# Patient Record
Sex: Female | Born: 1990 | Race: White | Hispanic: No | Marital: Single | Smoking: Never smoker
Health system: Southern US, Community
[De-identification: ages and names within clinical notes are randomized; demographics above are authoritative.]

---
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
## 2019-10-19 ENCOUNTER — Encounter: Payer: Self-pay | Admitting: Family Medicine

## 2019-10-19 ENCOUNTER — Ambulatory Visit: Payer: Managed Care, Other (non HMO) | Admitting: Family Medicine

## 2019-10-19 ENCOUNTER — Other Ambulatory Visit: Payer: Self-pay

## 2019-10-19 VITALS — BP 124/80 | HR 84 | Ht 59.0 in | Wt 116.6 lb

## 2019-10-19 DIAGNOSIS — Z124 Encounter for screening for malignant neoplasm of cervix: Secondary | ICD-10-CM | POA: Diagnosis not present

## 2019-10-19 DIAGNOSIS — Z3041 Encounter for surveillance of contraceptive pills: Secondary | ICD-10-CM

## 2019-10-19 DIAGNOSIS — N3281 Overactive bladder: Secondary | ICD-10-CM | POA: Diagnosis not present

## 2019-10-19 DIAGNOSIS — R350 Frequency of micturition: Secondary | ICD-10-CM

## 2019-10-19 DIAGNOSIS — F418 Other specified anxiety disorders: Secondary | ICD-10-CM

## 2019-10-19 DIAGNOSIS — G4700 Insomnia, unspecified: Secondary | ICD-10-CM

## 2019-10-19 LAB — POCT URINALYSIS DIP (PROADVANTAGE DEVICE)
Bilirubin, UA: NEGATIVE
Blood, UA: NEGATIVE
Glucose, UA: NEGATIVE mg/dL
Leukocytes, UA: NEGATIVE
Nitrite, UA: NEGATIVE
Protein Ur, POC: NEGATIVE mg/dL
Specific Gravity, Urine: 1.015
Urobilinogen, Ur: NEGATIVE
pH, UA: 8 (ref 5.0–8.0)

## 2019-10-19 MED ORDER — PROPRANOLOL HCL 10 MG PO TABS: 10.0000 mg | ORAL_TABLET | Freq: Every day | ORAL | 1 refills | Status: DC
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
## 2019-10-19 NOTE — Patient Instructions (Addendum)
Take the Propranolol 30-60 minutes before your anxiety usually starts.    You can call to schedule your appointment with the psychiatrist/counselor. A few offices are listed below for you to call.    [facility]  Ask for a psychiatrist  [address]  (across from [facility])  [phone]      [facility] [address] [place], [place] [address] [phone]   [facility]  [address], [place], [place] [address]  Phone: [phone]   [facility] [address] [place], [place] [address]  Phone: [phone]

## 2019-10-20 LAB — CBC WITH DIFFERENTIAL/PLATELET
Basophils Absolute: 0.1 10*3/uL (ref 0.0–0.2)
Basos: 1 %
EOS (ABSOLUTE): 0 10*3/uL (ref 0.0–0.4)
Eos: 0 %
Hematocrit: 39 % (ref 34.0–46.6)
Hemoglobin: 13 g/dL (ref 11.1–15.9)
Immature Grans (Abs): 0 10*3/uL (ref 0.0–0.1)
Immature Granulocytes: 0 %
Lymphocytes Absolute: 2.2 10*3/uL (ref 0.7–3.1)
Lymphs: 26 %
MCH: 29.9 pg (ref 26.6–33.0)
MCHC: 33.3 g/dL (ref 31.5–35.7)
MCV: 90 fL (ref 79–97)
Monocytes Absolute: 0.5 10*3/uL (ref 0.1–0.9)
Monocytes: 7 %
Neutrophils Absolute: 5.5 10*3/uL (ref 1.4–7.0)
Neutrophils: 66 %
Platelets: 343 10*3/uL (ref 150–450)
RBC: 4.35 x10E6/uL (ref 3.77–5.28)
RDW: 12.1 % (ref 11.7–15.4)
WBC: 8.3 10*3/uL (ref 3.4–10.8)

## 2019-10-20 LAB — COMPREHENSIVE METABOLIC PANEL
ALT: 13 IU/L (ref 0–32)
AST: 12 IU/L (ref 0–40)
Albumin/Globulin Ratio: 1.5 (ref 1.2–2.2)
Albumin: 4.6 g/dL (ref 3.9–5.0)
Alkaline Phosphatase: 60 IU/L (ref 44–121)
BUN/Creatinine Ratio: 14 (ref 9–23)
BUN: 10 mg/dL (ref 6–20)
Bilirubin Total: 0.4 mg/dL (ref 0.0–1.2)
CO2: 21 mmol/L (ref 20–29)
Calcium: 9.7 mg/dL (ref 8.7–10.2)
Chloride: 101 mmol/L (ref 96–106)
Creatinine, Ser: 0.69 mg/dL (ref 0.57–1.00)
GFR calc Af Amer: 138 mL/min/{1.73_m2} (ref >59)
GFR calc non Af Amer: 120 mL/min/{1.73_m2} (ref >59)
Globulin, Total: 3 g/dL (ref 1.5–4.5)
Glucose: 81 mg/dL (ref 65–99)
Potassium: 4.5 mmol/L (ref 3.5–5.2)
Sodium: 139 mmol/L (ref 134–144)
Total Protein: 7.6 g/dL (ref 6.0–8.5)

## 2019-10-20 LAB — TSH: TSH: 1.1 u[IU]/mL (ref 0.450–4.500)

## 2019-10-20 LAB — T4, FREE: Free T4: 1.16 ng/dL (ref 0.82–1.77)

## 2019-10-20 LAB — T3: T3, Total: 174 ng/dL (ref 71–180)

## 2019-10-20 NOTE — Progress Notes (Signed)
Her labs are normal including blood counts, electrolytes, kidney, liver, and thyroid.

## 2019-11-09 ENCOUNTER — Telehealth: Payer: Self-pay | Admitting: Family Medicine

## 2019-11-09 NOTE — Telephone Encounter (Signed)
Requested records received from [facility]

## 2019-11-10 ENCOUNTER — Encounter: Payer: Self-pay | Admitting: Internal Medicine

## 2019-11-10 ENCOUNTER — Other Ambulatory Visit: Payer: Self-pay | Admitting: Family Medicine

## 2019-11-10 DIAGNOSIS — F418 Other specified anxiety disorders: Secondary | ICD-10-CM

## 2019-11-11 ENCOUNTER — Encounter: Payer: Self-pay | Admitting: Family Medicine

## 2019-11-22 ENCOUNTER — Other Ambulatory Visit: Payer: Self-pay

## 2019-11-22 ENCOUNTER — Ambulatory Visit (INDEPENDENT_AMBULATORY_CARE_PROVIDER_SITE_OTHER): Payer: Managed Care, Other (non HMO) | Admitting: Nurse Practitioner

## 2019-11-22 ENCOUNTER — Encounter: Payer: Self-pay | Admitting: Nurse Practitioner

## 2019-11-22 VITALS — BP 120/78 | Wt 114.0 lb

## 2019-11-22 DIAGNOSIS — Z01419 Encounter for gynecological examination (general) (routine) without abnormal findings: Secondary | ICD-10-CM | POA: Diagnosis not present

## 2019-11-22 DIAGNOSIS — Z3041 Encounter for surveillance of contraceptive pills: Secondary | ICD-10-CM | POA: Diagnosis not present

## 2019-11-22 DIAGNOSIS — N3281 Overactive bladder: Secondary | ICD-10-CM

## 2019-11-22 MED ORDER — PIRMELLA 7/7/7 0.5/0.75/1-35 MG-MCG PO TABS: 1.0000 | ORAL_TABLET | Freq: Every day | ORAL | 3 refills | Status: DC

## 2019-11-22 NOTE — Progress Notes (Signed)
   Gina Mcfarland Oct 08, 1990 [id number]   History:  30 y.o. G0 presents to establish care. Monthly cycle on OCPs. Normal pap history. OAB. Prior to starting Ditropan she was voiding hourly and 6+ times a night. She voids much less but still gets up to urinate 2-3 times per night and would like better management of this. Saw urology in the past for this and was told her bladder receptors are overactive. She has tried [employer] but it did not help at all. Not currently sexually active. She is unsure if she has had Gardasil series and will look into it. Takes propanolol for situational anxiety. Has had a hard time finding therapist who is taking new patients.   Gynecologic History Patient's last menstrual period was 11/20/2019. Period Cycle (Days): 28 Period Duration (Days): 5 Period Pattern: Regular Menstrual Flow: Moderate Menstrual Control: Maxi pad, Tampon Dysmenorrhea: (!) Mild Dysmenorrhea Symptoms: Cramping Contraception: OCP (estrogen/progesterone) Last Pap: 5 years ago per patient. Results were: normal  Past medical history, past surgical history, family history and social history were all reviewed and documented in the EPIC chart.  ROS:  A ROS was performed and pertinent positives and negatives are included.  Exam:  Vitals:   11/22/19 1605  BP: 120/78  Weight: 114 lb (51.7 kg)   Body mass index is 23.03 kg/m.  General appearance:  Normal Thyroid:  Symmetrical, normal in size, without palpable masses or nodularity. Respiratory  Auscultation:  Clear without wheezing or rhonchi Cardiovascular  Auscultation:  Regular rate, without rubs, murmurs or gallops  Edema/varicosities:  Not grossly evident Abdominal  Soft,nontender, without masses, guarding or rebound.  Liver/spleen:  No organomegaly noted  Hernia:  None appreciated  Skin  Inspection:  Grossly normal   Breasts: Examined lying and sitting.   Right: Without masses, retractions, discharge or axillary  adenopathy.   Left: Without masses, retractions, discharge or axillary adenopathy. Gentitourinary   Inguinal/mons:  Normal without inguinal adenopathy  External genitalia:  Normal  BUS/Urethra/Skene's glands:  Normal  Vagina:  Normal  Cervix:  Normal  Uterus:  Normal in size, shape and contour.  Midline and mobile  Adnexa/parametria:     Rt: Without masses or tenderness.   Lt: Without masses or tenderness.  Anus and perineum: Normal  Assessment/Plan:  29 y.o. G0 to establish care.  Well female exam with routine gynecological exam - Education provided on SBEs, importance of preventative screenings, current guidelines, high calcium diet, regular exercise, and multivitamin daily. Labs with PCP.   Encounter for surveillance of contraceptive pills - Plan: norethindrone-ethinyl estradiol (PIRMELLA 7/7/7) 0.5/0.75/1-35 MG-MCG tablet daily. Having monthly cycle. Taking as prescribed. Refill x 1 year provided.   Overactive bladder - Prior to starting Ditropan she was voiding hourly and 6+ times a night. She voids much less but still gets up to urinate 2-3 times per night and would like better management for this. Saw urology in the past for this. Will send referral to Urogynecology.   Screening for cervical cancer - normal pap history. Pap with reflex today.   Follow up in 1 year for annual.      Gina Mcfarland [facility], 4:25 PM 11/22/2019

## 2019-11-22 NOTE — Patient Instructions (Signed)
Health Maintenance, Female Adopting a healthy lifestyle and getting preventive care are important in promoting health and wellness. Ask your health care provider about:  The right schedule for you to have regular tests and exams.  Things you can do on your own to prevent diseases and keep yourself healthy. What should I know about diet, weight, and exercise? Eat a healthy diet   Eat a diet that includes plenty of vegetables, fruits, low-fat dairy products, and lean protein.  Do not eat a lot of foods that are high in solid fats, added sugars, or sodium. Maintain a healthy weight Body mass index (BMI) is used to identify weight problems. It estimates body fat based on height and weight. Your health care provider can help determine your BMI and help you achieve or maintain a healthy weight. Get regular exercise Get regular exercise. This is one of the most important things you can do for your health. Most adults should:  Exercise for at least 150 minutes each week. The exercise should increase your heart rate and make you sweat (moderate-intensity exercise).  Do strengthening exercises at least twice a week. This is in addition to the moderate-intensity exercise.  Spend less time sitting. Even light physical activity can be beneficial. Watch cholesterol and blood lipids Have your blood tested for lipids and cholesterol at 27 years of age, then have this test every 5 years. Have your cholesterol levels checked more often if:  Your lipid or cholesterol levels are high.  You are older than 27 years of age.  You are at high risk for heart disease. What should I know about cancer screening? Depending on your health history and family history, you may need to have cancer screening at various ages. This may include screening for:  Breast cancer.  Cervical cancer.  Colorectal cancer.  Skin cancer.  Lung cancer. What should I know about heart disease, diabetes, and high blood  pressure? Blood pressure and heart disease  High blood pressure causes heart disease and increases the risk of stroke. This is more likely to develop in people who have high blood pressure readings, are of African descent, or are overweight.  Have your blood pressure checked: ? Every 3-5 years if you are 18-39 years of age. ? Every year if you are 40 years old or older. Diabetes Have regular diabetes screenings. This checks your fasting blood sugar level. Have the screening done:  Once every three years after age 40 if you are at a normal weight and have a low risk for diabetes.  More often and at a younger age if you are overweight or have a high risk for diabetes. What should I know about preventing infection? Hepatitis B If you have a higher risk for hepatitis B, you should be screened for this virus. Talk with your health care provider to find out if you are at risk for hepatitis B infection. Hepatitis C Testing is recommended for:  Everyone born from 1945 through 1965.  Anyone with known risk factors for hepatitis C. Sexually transmitted infections (STIs)  Get screened for STIs, including gonorrhea and chlamydia, if: ? You are sexually active and are younger than 27 years of age. ? You are older than 27 years of age and your health care provider tells you that you are at risk for this type of infection. ? Your sexual activity has changed since you were last screened, and you are at increased risk for chlamydia or gonorrhea. Ask your health care provider if   you are at risk.  Ask your health care provider about whether you are at high risk for HIV. Your health care provider may recommend a prescription medicine to help prevent HIV infection. If you choose to take medicine to prevent HIV, you should first get tested for HIV. You should then be tested every 3 months for as long as you are taking the medicine. Pregnancy  If you are about to stop having your period (premenopausal) and  you may become pregnant, seek counseling before you get pregnant.  Take 400 to 800 micrograms (mcg) of folic acid every day if you become pregnant.  Ask for birth control (contraception) if you want to prevent pregnancy. Osteoporosis and menopause Osteoporosis is a disease in which the bones lose minerals and strength with aging. This can result in bone fractures. If you are 65 years old or older, or if you are at risk for osteoporosis and fractures, ask your health care provider if you should:  Be screened for bone loss.  Take a calcium or vitamin D supplement to lower your risk of fractures.  Be given hormone replacement therapy (HRT) to treat symptoms of menopause. Follow these instructions at home: Lifestyle  Do not use any products that contain nicotine or tobacco, such as cigarettes, e-cigarettes, and chewing tobacco. If you need help quitting, ask your health care provider.  Do not use street drugs.  Do not share needles.  Ask your health care provider for help if you need support or information about quitting drugs. Alcohol use  Do not drink alcohol if: ? Your health care provider tells you not to drink. ? You are pregnant, may be pregnant, or are planning to become pregnant.  If you drink alcohol: ? Limit how much you use to 0-1 drink a day. ? Limit intake if you are breastfeeding.  Be aware of how much alcohol is in your drink. In the [place] one drink equals one 12 oz bottle of beer (355 mL), one 5 oz glass of wine (148 mL), or one 1 oz glass of hard liquor (44 mL). General instructions  Schedule regular health, dental, and eye exams.  Stay current with your vaccines.  Tell your health care provider if: ? You often feel depressed. ? You have ever been abused or do not feel safe at home. Summary  Adopting a healthy lifestyle and getting preventive care are important in promoting health and wellness.  Follow your health care provider's instructions about healthy  diet, exercising, and getting tested or screened for diseases.  Follow your health care provider's instructions on monitoring your cholesterol and blood pressure. This information is not intended to replace advice given to you by your health care provider. Make sure you discuss any questions you have with your health care provider. Document Revised: 12/23/2017 Document Reviewed: 12/23/2017 Elsevier Patient Education  2020 Elsevier Inc.  

## 2019-11-22 NOTE — Addendum Note (Signed)
Addended by: Tito Dine on: 11/22/2019 04:36 PM   Modules accepted: Orders

## 2019-11-23 ENCOUNTER — Telehealth: Payer: Self-pay | Admitting: *Deleted

## 2019-11-23 DIAGNOSIS — N3281 Overactive bladder: Secondary | ICD-10-CM

## 2019-11-23 LAB — PAP IG W/ RFLX HPV ASCU

## 2019-11-23 NOTE — Telephone Encounter (Signed)
Referral placed at Dr.Shroeder office they will call to schedule.

## 2019-11-23 NOTE — Telephone Encounter (Signed)
-----   Message from [name], NP sent at 11/22/2019  4:33 PM EST ----- Please send referral to Dr. Tildon Husky with Urogynecology for overactive bladder.

## 2019-12-12 NOTE — Telephone Encounter (Signed)
Scheduled on 12/19/19 @ 3:00pm
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
## 2019-12-19 ENCOUNTER — Other Ambulatory Visit: Payer: Self-pay

## 2019-12-19 ENCOUNTER — Ambulatory Visit (INDEPENDENT_AMBULATORY_CARE_PROVIDER_SITE_OTHER): Payer: Managed Care, Other (non HMO) | Admitting: Obstetrics and Gynecology

## 2019-12-19 ENCOUNTER — Encounter: Payer: Self-pay | Admitting: Obstetrics and Gynecology

## 2019-12-19 VITALS — BP 118/63 | HR 68 | Temp 98.2°F | Ht 59.0 in | Wt 114.5 lb

## 2019-12-19 DIAGNOSIS — R350 Frequency of micturition: Secondary | ICD-10-CM | POA: Diagnosis not present

## 2019-12-19 DIAGNOSIS — K5900 Constipation, unspecified: Secondary | ICD-10-CM | POA: Diagnosis not present

## 2019-12-19 DIAGNOSIS — R351 Nocturia: Secondary | ICD-10-CM | POA: Diagnosis not present

## 2019-12-19 LAB — POCT URINALYSIS DIPSTICK
Appearance: NORMAL
Bilirubin, UA: NEGATIVE
Blood, UA: NEGATIVE
Glucose, UA: NEGATIVE
Leukocytes, UA: NEGATIVE
Nitrite, UA: NEGATIVE
Protein, UA: NEGATIVE
Spec Grav, UA: 1.025 (ref 1.010–1.025)
Urobilinogen, UA: 0.2 E.U./dL
pH, UA: 6 (ref 5.0–8.0)

## 2019-12-19 NOTE — Patient Instructions (Addendum)
Constipation: Our goal is to achieve formed bowel movements daily or every-other-day.  You may need to try different combinations of the following options to find what works best for you - everybody's body works differently so feel free to adjust the dosages as needed.  Some options to help maintain bowel health include:  Marland Kitchen Dietary changes (more leafy greens, vegetables and fruits; less processed foods) . Fiber supplementation (Benefiber, FiberCon, Metamucil or Psyllium). Start slow and increase gradually to full dose. . Over-the-counter agents such as: stool softeners (Docusate or Colace) and/or laxatives (Miralax, milk of magnesia)  . "Power Pudding" is a natural mixture that may help your constipation.  To make blend 1 cup applesauce, 1 cup wheat bran, and 3/4 cup prune juice, refrigerate and then take 1 tablespoon daily with a large glass of water as needed.   Today we talked about ways to manage bladder urgency such as altering your diet to avoid irritative beverages and foods (bladder diet) as well as attempting to decrease stress and other exacerbating factors.    There is a website with helpful information for people with bladder irritation, called the IC Network at https://www.ic-network.com. This website has more information about a healthy bladder diet and patient forums for support.  The Most Bothersome Foods* The Least Bothersome Foods*  Coffee - Regular & Decaf Tea - caffeinated Carbonated beverages - cola, non-colas, diet & caffeine-free Alcohols - Beer, Red Wine, White Wine, [address] - Grapefruit, [place], Orange, [employer] - Cranberry, Grapefruit, Orange, Pineapple Vegetables - Tomato & Tomato Products Flavor Enhancers - Hot peppers, Spicy foods, Chili, Horseradish, Vinegar, Monosodium glutamate (MSG) Artificial Sweeteners - NutraSweet, Sweet 'N Low, Equal (sweetener), Saccharin Ethnic foods - [place], [place], [place] food [employer] - low-fat & whole Fruits -  Bananas, Blueberries, Honeydew melon, Pears, Raisins, Watermelon Vegetables - Broccoli, [address] Sprouts, [place], Carrots, Cauliflower, [place], Cucumber, Mushrooms, Peas, Radishes, Squash, Zucchini, White potatoes, Sweet potatoes & yams Poultry - Chicken, Eggs, [place], [employer] - Beef, Diplomatic Services operational officer, Lamb Seafood - Shrimp, [place] fish, Salmon Grains - Oat, Rice Snacks - Pretzels, Popcorn  *Lenward Chancellor et al. Diet and its role in interstitial cystitis/bladder pain syndrome (IC/BPS) and comorbid conditions. [employer]. BJU Int. 2012 Jan 11.

## 2020-01-17 ENCOUNTER — Encounter: Payer: Managed Care, Other (non HMO) | Attending: Obstetrics and Gynecology | Admitting: Physical Therapy

## 2020-01-17 ENCOUNTER — Encounter: Payer: Self-pay | Admitting: Physical Therapy

## 2020-01-17 ENCOUNTER — Other Ambulatory Visit: Payer: Self-pay

## 2020-01-17 DIAGNOSIS — R278 Other lack of coordination: Secondary | ICD-10-CM | POA: Diagnosis not present

## 2020-01-17 DIAGNOSIS — M6281 Muscle weakness (generalized): Secondary | ICD-10-CM

## 2020-01-17 DIAGNOSIS — R252 Cramp and spasm: Secondary | ICD-10-CM | POA: Insufficient documentation

## 2020-01-17 NOTE — Therapy (Signed)
[facility] at [facility] [address], [address] [place], [place], [address] Phone: [phone]   Fax:  [phone]  Physical Therapy Evaluation  Patient Details  Name: Gina Mcfarland MRN: [id number] Date of Birth: [birth year] Referring Provider (PT): Dr. Earlean Polka   Encounter Date: 01/17/2020   PT End of Session - 01/17/20 1048    Visit Number 1    Date for PT Re-Evaluation 04/10/20    Authorization Type [employer]    PT Start Time 0830    PT Stop Time 0925    PT Time Calculation (min) 55 min    Activity Tolerance Patient tolerated treatment well    Behavior During Therapy [facility] for tasks assessed/performed           History reviewed. No pertinent past medical history.  History reviewed. No pertinent surgical history.  There were no vitals filed for this visit.    Subjective Assessment - 01/17/20 0829    Subjective Patient reports 6 years ago her urinary frequency increased and is getting worse. She is not drinking before bed and limiting caffiene. Tried The Oxybutin. She urinated 24 times in 24 hours. Most of urination in the middle of the night. Wakes up 5 times per night. A good night goes 2 times per night but that is rare. When patient goes to her parents in [place] will urinate more. Patient is seeing a therapist to help her. Urinate just in case. Patient has a dog.    Patient Stated Goals improve sleep by not urinating as much, possible get off medication due to increased urination    Currently in Pain? No/denies              [facility] PT Assessment - 01/17/20 9326      Strength   Right Hip Extension 4/5    Right Hip ABduction 3+/5      Palpation   Spinal mobility Decreased lower rib cage mobility    SI assessment  Left ilim post. rotated      Special Tests    Special Tests Sacrolliac Tests      Pelvic Compression   Findings Positive    Side Left    comment pain                      Objective  measurements completed on examination: See above findings.     Pelvic Floor Special Questions - 01/17/20 0001    Prior Pregnancies No    Currently Sexually Active No   in the past did not hurt   Urinary Leakage Yes    Activities that cause leaking Sneezing;Other    Other activities that cause leaking vomiting    Urinary urgency Yes   during the day better, night not easy   Urinary frequency 24 times in 24 hours, if delays during long trip will have increased pain, has to push to have a urine stream, sometimes has a weak urine stream    Fecal incontinence No   Type 4, night has feeling for BM but not able to   Caffeine beverages [place] travel mug in AM, ginger ale in afternoon    Falling out feeling (prolapse) No    Skin Integrity Intact   some dryness   Perineal Body/Introitus  Elevated   tight   Pelvic Floor Internal Exam Patient confirms identification to assess pelvicmuscles and treatment    Exam Type Vaginal    Palpation perineal body, bulbocavernosus, urethra sphincter are tight  Strength Flicker   difficulty coordinating contraction   Tone increased tone            [facility] Adult PT Treatment/Exercise - 01/17/20 0001      Self-Care   Self-Care Other Self-Care Comments    Other Self-Care Comments  education with you tube videos for pelvic floor meditation and perineal massage                  PT Education - 01/17/20 0935    Education Details Access Code: [email or web address]; meditation, perineal massage    Person(s) Educated Patient    Methods Explanation;Demonstration;Verbal cues;Handout    Comprehension Verbalized understanding;Returned demonstration            PT Short Term Goals - 01/17/20 1100      PT SHORT TERM GOAL #1   Title independent iwth intial stretchs of the hips to elongate the pelvic floor    Time 4    Period Weeks    Status New    Target Date 02/14/20      PT SHORT TERM GOAL #2   Title able to perform diaphragmatic breathing to open up the  pelvic floor to reduce tightness    Time 4    Period Weeks    Status New    Target Date 02/14/20      PT SHORT TERM GOAL #3   Title able to expand the lower ribe cage adn open up the back to move the diaphragm fully    Time 4    Period Weeks    Status New    Target Date 02/14/20             PT Long Term Goals - 01/17/20 1102      PT LONG TERM GOAL #1   Title independent with advanced HEP    Time 12    Period Weeks    Status New    Target Date 04/10/20      PT LONG TERM GOAL #2   Title reduce the need to urinate at night to 2 times weekly compared to 5 times per night due to the ability to relax the pelvic floor    Time 12    Period Weeks    Status New    Target Date 04/10/20      PT LONG TERM GOAL #3   Title able to wait 3 or more hours to urinate during the day due to using behavoral techniques    Time 12    Period Weeks    Status New    Target Date 04/10/20      PT LONG TERM GOAL #4   Title pelvic floro coordination improved and strength >/= 3/5 so she is not leaking urine with coughing and vomiting    Time 12    Period Weeks    Status New    Target Date 04/10/20                  Plan - 01/17/20 0936    Clinical Impression Statement Patient is a 30 year old female with frequent urination and nocturia for the past 6 years. Patient will wake up 5 times per night and urinate 24 times in a 24 hour period. Night time is the hardest to go back to sleep after having the urge to urinate. When she is staying at her parents the urge to urinate is more frequent and more at night. Patient has difficulty with getting enough sleep due  to the urge to urinate frequently. She has full lumbar ROM. the left ilium is rotated posteriorly with a positive compression test. She has weakness in the right hip abduction and extensor. Pelvic floor strength is 1/5 and she has difficulty with coordination of a contraction and needs many verbal cues. She has tightness in the perineal  body, urethra sphincter, bulbocavernosus. The pelvic floor muscles have increased tone. She needs many verbal cues to elongate the  pelvic floor with breath but was able to do it partially one time. Lower rib cage has decreased movement. Patient will leak urine with a hard cough or when vomiting. Patient will benefit from skilled therapy to elongate the pelvic floor to decreased tone and irritation to cause urge to void.    Personal Factors and Comorbidities Time since onset of injury/illness/exacerbation    Examination-Activity Limitations Sleep;Continence    Stability/Clinical Decision Making Stable/Uncomplicated    Clinical Decision Making Low    Rehab Potential Excellent    PT Frequency 1x / week    PT Duration 12 weeks    PT Treatment/Interventions ADLs/Self Care Home Management;Biofeedback;Electrical Stimulation;Moist Heat;Ultrasound;Therapeutic activities;Therapeutic exercise;Neuromuscular re-education;Patient/family education;Manual techniques;Dry needling;Spinal Manipulations    PT Next Visit Plan continues with stretches for piriformis, reverse clams, pull backs, manual owrk to expand the lower rib cage, sitting on ball to massage, see how the pelvic drop is doing, urge to void, squat to stretch pelvic floor    PT Home Exercise Plan Access Code: [email or web address]    Consulted and Agree with Plan of Care Patient           Patient will benefit from skilled therapeutic intervention in order to improve the following deficits and impairments:  Decreased endurance,Decreased activity tolerance,Decreased strength,Increased fascial restricitons,Increased muscle spasms,Decreased coordination  Visit Diagnosis: Muscle weakness (generalized) - Plan: PT plan of care cert/re-cert  Other lack of coordination - Plan: PT plan of care cert/re-cert  Cramp and spasm - Plan: PT plan of care cert/re-cert     Problem List There are no problems to display for this patient.   Eulis Foster, PT 01/17/20 11:07  AM   [facility] at [facility] [address] [place], [place], [address] Phone: [phone]   Fax:  [phone]  Name: Reneta Niehaus MRN: [id number] Date of Birth: Feb 24, 1990

## 2020-01-17 NOTE — Patient Instructions (Addendum)
   Guided Meditation for Pelvic Floor Relaxation  [employer]  Ujjayi Breathing + Letting Go of Pain or Anxiety (Meditation) [id number] viewsJun 12, 2017 by fem fusion    The "Pelvic Drop" to Release Pelvic Floor Tension: Three Visualizations by fem fusion  Gina Mcfarland, PT [facility] [address] [place], [place] [address] W: [phone] [email or web address]  Access Code: [email or web address] URL: [email or web address] Date: 01/17/2020 Prepared by: Gina Mcfarland  Exercises Supine Diaphragmatic Breathing - 1 x daily - 7 x weekly - 1 sets - 10 reps Supine Pelvic Floor Stretch - 1 x daily - 7 x weekly - 1 sets - 1 reps - 1 min hold Hip Hinge Rock Back - 1 x daily - 7 x weekly - 1 sets - 10 reps

## 2020-01-19 ENCOUNTER — Ambulatory Visit: Payer: Managed Care, Other (non HMO) | Admitting: Family Medicine

## 2020-01-20 ENCOUNTER — Encounter: Payer: Self-pay | Admitting: Family Medicine

## 2020-01-24 ENCOUNTER — Encounter: Payer: Managed Care, Other (non HMO) | Admitting: Physical Therapy

## 2020-01-24 ENCOUNTER — Encounter: Payer: Self-pay | Admitting: Physical Therapy

## 2020-01-24 ENCOUNTER — Other Ambulatory Visit: Payer: Self-pay

## 2020-01-24 DIAGNOSIS — R278 Other lack of coordination: Secondary | ICD-10-CM

## 2020-01-24 DIAGNOSIS — R252 Cramp and spasm: Secondary | ICD-10-CM

## 2020-01-24 DIAGNOSIS — M6281 Muscle weakness (generalized): Secondary | ICD-10-CM

## 2020-01-24 NOTE — Patient Instructions (Addendum)
STRETCHING THE PELVIC FLOOR MUSCLES NO DILATOR  Supplies . Vaginal lubricant . Mirror (optional) . Gloves (optional) or clean hands Positioning . Start in a semi-reclined position with your head propped up. Bend your knees and place your thumb or finger at the vaginal opening. Procedure . Apply a moderate amount of lubricant on the outer skin of your vagina, the labia minora.  Apply additional lubricant to your finger. Marland Kitchen Spread the skin away from the vaginal opening. Place the end of your finger at the opening. . Do a maximum contraction of the pelvic floor muscles. Tighten the vagina and the anus maximally and relax. . When you know they are relaxed, gently and slowly insert your finger into your vagina, directing your finger slightly downward, for 2-3 inches of insertion. . Relax and stretch the 6 o'clock position . Hold each stretch for _30-60 seconds, no pain more than 3/10 . Repeat the stretching in the 4 o'clock and 8 o'clock positions. . Next gently move your finger in a "U" shape  several times.  . You can also enter a second finger to work to spread the vaginal opening wider from 3:00-6:00 and 6:00-9:00 or 3:00-9:00 . Perform daily or every other day . Once you have accomplished the techniques you may try them in standing with one foot resting on the tub, or in other positions.  This is a good stretch to do in the shower if you don't need to use lubricant.   Eulis Foster, PT [facility] [address], [address] [place], [place] [address] W: [phone] [email or web address]   Access Code: [email or web address] URL: [email or web address] Date: 01/24/2020 Prepared by: Eulis Foster  Program Notes sit on a tennis ball and roll back and forth to massage the pelvic floor   Exercises Supine Diaphragmatic Breathing - 1 x daily - 7 x weekly - 1 sets - 10 reps Supine Pelvic Floor Stretch - 1 x daily - 7 x weekly - 1 sets - 1 reps - 1 min hold Hip Hinge  Rock Back - 1 x daily - 7 x weekly - 1 sets - 10 reps Deep Squat with Pelvic Floor Relaxation - 1 x daily - 7 x weekly - 1 sets - 1 reps - 30-60 sec hold Pigeon Pose - 1 x daily - 7 x weekly - 1 sets - 1 reps - 30 sec hold

## 2020-01-24 NOTE — Therapy (Signed)
[facility] at [facility] [address] [place], [place], [address] Phone: [phone]   Fax:  [phone]  Physical Therapy Treatment  Patient Details  Name: Gina Mcfarland MRN: [id number] Date of Birth: September 26, 1990 Referring Provider (PT): Dr. Earlean Polka   Encounter Date: 01/24/2020   PT End of Session - 01/24/20 1601    Visit Number 2    Date for PT Re-Evaluation 04/10/20    Authorization Type [employer]igna    PT Start Time 1500    PT Stop Time 1555    PT Time Calculation (min) 55 min    Activity Tolerance Patient tolerated treatment well;No increased pain    Behavior During Therapy [facility] for tasks assessed/performed           History reviewed. No pertinent past medical history.  History reviewed. No pertinent surgical history.  There were no vitals filed for this visit.   Subjective Assessment - 01/24/20 1513    Subjective I started the exericses 4 days ago. I have a couple of nights sleeping to 4.    Patient Stated Goals improve sleep by not urinating as much, possible get off medication due to increased urination    Currently in Pain? No/denies    Multiple Pain Sites No                          Pelvic Floor Special Questions - 01/24/20 0001    Pelvic Floor Internal Exam Patient confirms identification to assess pelvicmuscles and treatment    Exam Type Vaginal    Palpation perineal body, bulbocavernosus, urethra sphincter are tight             [facility] Adult PT Treatment/Exercise - 01/24/20 0001      Lumbar Exercises: Stretches   Piriformis Stretch Right;Left;1 rep;30 seconds    Piriformis Stretch Limitations pigeon pose    Other Lumbar Stretch Exercise deep squat with diaphragmatic breathing    Other Lumbar Stretch Exercise happy baby holding for 1 min      Lumbar Exercises: Sidelying   Other Sidelying Lumbar Exercises reverse clamb 10x each side      Manual Therapy   Manual Therapy Internal  Pelvic Floor    Manual therapy comments educated patient on how to massage the perineal muscles at home buy using her thumb internally and sitting on a tennis ball to massage the muscles    Internal Pelvic Floor manual work to the perineal body, bulbocavernosus, superior transverse in right sidely                  PT Education - 01/24/20 1558    Education Details Access Code: [email or web address]; perineal massage internally and outside    [employer]) Educated Patient    Methods Explanation;Demonstration;Verbal cues;Handout    Comprehension Returned demonstration;Verbalized understanding            PT Short Term Goals - 01/24/20 1606      PT SHORT TERM GOAL #1   Title independent iwth intial stretchs of the hips to elongate the pelvic floor    Time 4    Period Weeks    Status On-going      PT SHORT TERM GOAL #2   Title able to perform diaphragmatic breathing to open up the pelvic floor to reduce tightness    Time 4    Period Weeks    Status Achieved  PT Long Term Goals - 01/17/20 1102      PT LONG TERM GOAL #1   Title independent with advanced HEP    Time 12    Period Weeks    Status New    Target Date 04/10/20      PT LONG TERM GOAL #2   Title reduce the need to urinate at night to 2 times weekly compared to 5 times per night due to the ability to relax the pelvic floor    Time 12    Period Weeks    Status New    Target Date 04/10/20      PT LONG TERM GOAL #3   Title able to wait 3 or more hours to urinate during the day due to using behavoral techniques    Time 12    Period Weeks    Status New    Target Date 04/10/20      PT LONG TERM GOAL #4   Title pelvic floro coordination improved and strength >/= 3/5 so she is not leaking urine with coughing and vomiting    Time 12    Period Weeks    Status New    Target Date 04/10/20                 Plan - 01/24/20 1602    Clinical Impression Statement Patient has been doing her exercises. She is  able to do diaphragmatic breathing and elongate the pelvic floor. Her perineal body, bulbocavernosus, and superior transverse muscle is tight. Patient has learned stretches to elongate the muscles. Patient has several nights she was able to sleep to 4 AM. Patient will benefit from skilled therapy to elongate the pelvic floor to decrease tone and irritation to cause urge to void.    Personal Factors and Comorbidities Time since onset of injury/illness/exacerbation    Examination-Activity Limitations Sleep;Continence    Stability/Clinical Decision Making Stable/Uncomplicated    Rehab Potential Excellent    PT Frequency 1x / week    PT Duration 12 weeks    PT Treatment/Interventions ADLs/Self Care Home Management;Biofeedback;Electrical Stimulation;Moist Heat;Ultrasound;Therapeutic activities;Therapeutic exercise;Neuromuscular re-education;Patient/family education;Manual techniques;Dry needling;Spinal Manipulations    PT Next Visit Plan manual pelvic  tissue work, go over the urge to void, inner thight, possible dry needling, check on the lower rib cage    PT Home Exercise Plan Access Code: [email or web address]    Recommended Other Services MD signed initial eval    Consulted and Agree with Plan of Care Patient           Patient will benefit from skilled therapeutic intervention in order to improve the following deficits and impairments:  Decreased endurance,Decreased activity tolerance,Decreased strength,Increased fascial restricitons,Increased muscle spasms,Decreased coordination  Visit Diagnosis: Muscle weakness (generalized)  Other lack of coordination  Cramp and spasm     Problem List There are no problems to display for this patient.   Eulis Foster, PT 01/24/20 4:07 PM   [facility] [facility] at [facility] [address], [address] [place], [place], [address] Phone: [phone]   Fax:  [phone]  Name: Kimyata Milich MRN: [id number] Date of Birth:  Jan 22, 1990

## 2020-02-02 ENCOUNTER — Ambulatory Visit: Payer: Managed Care, Other (non HMO) | Admitting: Family Medicine

## 2020-02-02 ENCOUNTER — Encounter: Payer: Self-pay | Admitting: Family Medicine

## 2020-02-02 ENCOUNTER — Other Ambulatory Visit: Payer: Self-pay

## 2020-02-02 VITALS — BP 110/70 | HR 85 | Wt 116.6 lb

## 2020-02-02 DIAGNOSIS — N3281 Overactive bladder: Secondary | ICD-10-CM

## 2020-02-02 DIAGNOSIS — F418 Other specified anxiety disorders: Secondary | ICD-10-CM

## 2020-02-02 MED ORDER — OXYBUTYNIN CHLORIDE ER 10 MG PO TB24: 10.0000 mg | ORAL_TABLET | Freq: Every day | ORAL | 2 refills | Status: DC

## 2020-02-02 NOTE — Progress Notes (Signed)
   Subjective:    Patient ID: Gina Mcfarland, female    DOB: 10/25/1990, 30 y.o.   MRN: [id number]  HPI Chief Complaint  Patient presents with  . follow-up    Anixety, going to PT for pelvic health and then going to counseling-   She is here for follow-up on chronic health conditions.  I last saw her in October 2021.  States her anxiety has improved.  She is involved in counseling, she now has a dog and is also taking propanolol as needed.  She reports noticing a significant improvement. Loralie Champagne is her counselor.   In regards to her overactive bladder, she is seeing a pelvic therapist and has noticed improvement in this area.  She is also taking oxybutynin and requests a refill.  States her goal is to eventually stop this medication.  No new concerns today.  Denies fever, chills, dizziness, chest pain, shortness of breath, abdominal pain, N/V/D.    Reviewed allergies, medications, past medical, surgical, family, and social history.    Review of Systems Pertinent positives and negatives in the history of present illness.     Objective:   Physical Exam BP 110/70   Pulse 85   Wt 116 lb 9.6 oz (52.9 kg)   BMI 23.55 kg/m   Alert and oriented in no acute distress.  Respirations unlabored.  Normal speech, mood.       Assessment & Plan:  Situational anxiety  OAB (overactive bladder) - Plan: oxybutynin (DITROPAN-XL) 10 MG 24 hr tablet  She is here today for follow-up and she appears to be doing much better than her previous visit.  She will continue with counseling and propanolol as needed.  Discussed that having a new companion such as her dog is of great benefit.  I will refill her oxybutynin.  She will continue with pelvic therapy.  Follow-up as needed.

## 2020-02-13 ENCOUNTER — Other Ambulatory Visit: Payer: Self-pay | Admitting: Family Medicine

## 2020-02-13 DIAGNOSIS — F418 Other specified anxiety disorders: Secondary | ICD-10-CM

## 2020-02-14 ENCOUNTER — Other Ambulatory Visit: Payer: Self-pay

## 2020-02-14 ENCOUNTER — Encounter: Payer: Self-pay | Admitting: Physical Therapy

## 2020-02-14 ENCOUNTER — Encounter: Payer: Managed Care, Other (non HMO) | Attending: Obstetrics and Gynecology | Admitting: Physical Therapy

## 2020-02-14 DIAGNOSIS — M6281 Muscle weakness (generalized): Secondary | ICD-10-CM | POA: Diagnosis not present

## 2020-02-14 DIAGNOSIS — R278 Other lack of coordination: Secondary | ICD-10-CM | POA: Diagnosis present

## 2020-02-14 DIAGNOSIS — R252 Cramp and spasm: Secondary | ICD-10-CM

## 2020-02-14 NOTE — Therapy (Signed)
[facility]patient Rehabilitation at [facility] [address], [address] [place], [place], [address] Phone: [phone]   Fax:  [phone]  Physical Therapy Treatment  Patient Details  Name: Gina Mcfarland MRN: [id number] Date of Birth: 1990-07-09 Referring Provider (PT): Dr. Earlean Polka   Encounter Date: 02/14/2020   PT End of Session - 02/14/20 1702    Visit Number 3    Date for PT Re-Evaluation 04/10/20    Authorization Type [employer]    PT Start Time 1606    PT Stop Time 1700    PT Time Calculation (min) 54 min    Activity Tolerance Patient tolerated treatment well;No increased pain    Behavior During Therapy [facility] for tasks assessed/performed           History reviewed. No pertinent past medical history.  History reviewed. No pertinent surgical history.  There were no vitals filed for this visit.   Subjective Assessment - 02/14/20 1614    Subjective I feel better. I have been sleeping more. Gets up 2-3 times but before it was every hour. Some days will go more than she wants and will do that when she is more anxious.    Patient Stated Goals improve sleep by not urinating as much, possible get off medication due to increased urination    Currently in Pain? No/denies              [facility] PT Assessment - 02/14/20 0001      Strength   Right Hip Extension 4/5    Right Hip ABduction 4/5      Palpation   SI assessment  ASIS is equal                         [facility] Adult PT Treatment/Exercise - 02/14/20 0001      Self-Care   Self-Care Other Self-Care Comments    Other Self-Care Comments  discussed her routine to include her exercises      Neuro Re-ed    Neuro Re-ed Details  pelvic floor drop with breath and therapist guing the pelvic floor muscles downward with right side moving better than the left      Manual Therapy   Manual Therapy Myofascial release;Internal Pelvic Floor    Myofascial Release release of the urogenital  diaphgram going throug the 3 layers, tissue rolling of the abdominals    Internal Pelvic Floor manual work to the perineal body, bulbocavernosus, superior transverse and levator ani in supine   left side tighter than the right                   PT Short Term Goals - 02/14/20 1705      PT SHORT TERM GOAL #1   Title independent iwth intial stretchs of the hips to elongate the pelvic floor    Time 4    Period Weeks    Status Achieved      PT SHORT TERM GOAL #2   Title able to perform diaphragmatic breathing to open up the pelvic floor to reduce tightness    Time 4    Period Weeks    Status Achieved      PT SHORT TERM GOAL #3   Title able to expand the lower ribe cage adn open up the back to move the diaphragm fully    Time 4    Period Weeks    Status On-going  PT Long Term Goals - 01/17/20 1102      PT LONG TERM GOAL #1   Title independent with advanced HEP    Time 12    Period Weeks    Status New    Target Date 04/10/20      PT LONG TERM GOAL #2   Title reduce the need to urinate at night to 2 times weekly compared to 5 times per night due to the ability to relax the pelvic floor    Time 12    Period Weeks    Status New    Target Date 04/10/20      PT LONG TERM GOAL #3   Title able to wait 3 or more hours to urinate during the day due to using behavoral techniques    Time 12    Period Weeks    Status New    Target Date 04/10/20      PT LONG TERM GOAL #4   Title pelvic floro coordination improved and strength >/= 3/5 so she is not leaking urine with coughing and vomiting    Time 12    Period Weeks    Status New    Target Date 04/10/20                 Plan - 02/14/20 1702    Clinical Impression Statement Patient is waking up 2-3 times per night now. Patient has increased strength in right hip abductors. Patient has more tightness in the left pelvic floor compared to the right. Patient is able to do the pelvic floor drop but more  movement on the right side. Patient will urinate more during the day when she is anxious. Patient will benefit from skilled therapy to elongate the pelvic floor to decrease tone and irritation to causes urge to void.    Personal Factors and Comorbidities Time since onset of injury/illness/exacerbation    Examination-Activity Limitations Sleep;Continence    Stability/Clinical Decision Making Stable/Uncomplicated    Rehab Potential Excellent    PT Frequency 1x / week    PT Duration 12 weeks    PT Treatment/Interventions ADLs/Self Care Home Management;Biofeedback;Electrical Stimulation;Moist Heat;Ultrasound;Therapeutic activities;Therapeutic exercise;Neuromuscular re-education;Patient/family education;Manual techniques;Dry needling;Spinal Manipulations    PT Next Visit Plan manual pelvic  tissue wor especially on the right, abdominal work with tissue rolling, go over the urge to void, inner thight,  check on the lower rib cage    PT Home Exercise Plan Access Code: [email or web address]    Consulted and Agree with Plan of Care Patient           Patient will benefit from skilled therapeutic intervention in order to improve the following deficits and impairments:  Decreased endurance,Decreased activity tolerance,Decreased strength,Increased fascial restricitons,Increased muscle spasms,Decreased coordination  Visit Diagnosis: Muscle weakness (generalized)  Other lack of coordination  Cramp and spasm     Problem List There are no problems to display for this patient.   Gina Mcfarland, PT 02/14/20 5:06 PM   [place] Outpatient Rehabilitation at [facility] [address] [place], [place], [address] Phone: [phone]   Fax:  [phone]  Name: Gina Mcfarland MRN: [id number] Date of Birth: 1990-09-20

## 2020-02-21 ENCOUNTER — Encounter: Payer: Managed Care, Other (non HMO) | Admitting: Physical Therapy

## 2020-02-21 ENCOUNTER — Encounter: Payer: Self-pay | Admitting: Physical Therapy

## 2020-02-21 ENCOUNTER — Other Ambulatory Visit: Payer: Self-pay

## 2020-02-21 DIAGNOSIS — R278 Other lack of coordination: Secondary | ICD-10-CM

## 2020-02-21 DIAGNOSIS — M6281 Muscle weakness (generalized): Secondary | ICD-10-CM

## 2020-02-21 DIAGNOSIS — R252 Cramp and spasm: Secondary | ICD-10-CM

## 2020-02-21 NOTE — Therapy (Signed)
[facility] Outpatient Rehabilitation at [facility] [address] [place], [place], [address] Phone: [phone]   Fax:  [phone]  Physical Therapy Treatment  Patient Details  Name: Gina Mcfarland MRN: [id number] Date of Birth: [birth year] Referring Provider (PT): Dr. Earlean Polka   Encounter Date: 02/21/2020   PT End of Session - 02/21/20 1649    Visit Number 4    Date for PT Re-Evaluation 04/10/20    Authorization Type [employer]    PT Start Time 1602    PT Stop Time 1650    PT Time Calculation (min) 48 min    Activity Tolerance Patient tolerated treatment well;No increased pain    Behavior During Therapy [facility] for tasks assessed/performed           History reviewed. No pertinent past medical history.  History reviewed. No pertinent surgical history.  There were no vitals filed for this visit.   Subjective Assessment - 02/21/20 1607    Subjective The past 2-3 nights I have slept very well. I have gotten up 2 times at night.    Patient Stated Goals improve sleep by not urinating as much, possible get off medication due to increased urination    Currently in Pain? No/denies                             [facility] Adult PT Treatment/Exercise - 02/21/20 0001      Self-Care   Self-Care Other Self-Care Comments    Other Self-Care Comments  discussed with patient  on ways to deter the urge the second time she wakes up to urinate and to set a new habit      Manual Therapy   Manual Therapy Myofascial release;Soft tissue mobilization    Manual therapy comments educated patient on how to mobilize the lower abdominal tissue with her lges up and moving the tissue from the pubic bone to the umbilicus    Soft tissue mobilization manual work to the diaphragm, suprapubically    Myofascial Release using a suction cup to release the abdominal tissue especially on the suprapubic area, pulling from the pubic bone to the umbilicus; tissue rolling of the  abdomen                  PT Education - 02/21/20 1648    Education Details manual mobilization of the suprapubic tissue and ways to deter the urge to urinate in the morning    Person(s) Educated Patient    Methods Explanation;Demonstration    Comprehension Verbalized understanding;Returned demonstration            PT Short Term Goals - 02/14/20 1705      PT SHORT TERM GOAL #1   Title independent iwth intial stretchs of the hips to elongate the pelvic floor    Time 4    Period Weeks    Status Achieved      PT SHORT TERM GOAL #2   Title able to perform diaphragmatic breathing to open up the pelvic floor to reduce tightness    Time 4    Period Weeks    Status Achieved      PT SHORT TERM GOAL #3   Title able to expand the lower ribe cage adn open up the back to move the diaphragm fully    Time 4    Period Weeks    Status On-going  PT Long Term Goals - 02/21/20 1615      PT LONG TERM GOAL #1   Title independent with advanced HEP    Time 12    Period Weeks    Status On-going      PT LONG TERM GOAL #2   Title reduce the need to urinate at night to 2 times weekly compared to 5 times per night due to the ability to relax the pelvic floor    Baseline wakes up 2 times per day    Time 12    Period Weeks    Status On-going      PT LONG TERM GOAL #3   Title able to wait 3 or more hours to urinate during the day due to using behavoral techniques    Baseline can wait longer when distracted    Time 12    Period Weeks    Status On-going      PT LONG TERM GOAL #4   Title pelvic floro coordination improved and strength >/= 3/5 so she is not leaking urine with coughing and vomiting    Time 12    Period Weeks    Status On-going                 Plan - 02/21/20 1649    Clinical Impression Statement Patient is able to wait 3 hours to urinate when she is keeping herself busy. She will sleep from 10:00 AM to 4:00AM then wake up again at 5:00 AM to  urinate. Went over ideas to distract herself t=for the 5:00 waking time an dnot to urinate. Patient does have tissue restrictions suprapubically and around the diaphragm. Patient is now exercising 4 times per week and walks her dog daily. Patient is progressing toward her goals. Patient will benefit from skilled therapy to elongate the pelvic floor to decrease tone and irritation to casies urge to void.    Personal Factors and Comorbidities Time since onset of injury/illness/exacerbation    Examination-Activity Limitations Sleep;Continence    Stability/Clinical Decision Making Stable/Uncomplicated    Rehab Potential Excellent    PT Frequency 1x / week    PT Duration 12 weeks    PT Treatment/Interventions ADLs/Self Care Home Management;Biofeedback;Electrical Stimulation;Moist Heat;Ultrasound;Therapeutic activities;Therapeutic exercise;Neuromuscular re-education;Patient/family education;Manual techniques;Dry needling;Spinal Manipulations    PT Next Visit Plan see patient in 2 weeks; assess the urge to void, assess the pelvic floor for tightness    PT Home Exercise Plan Access Code: [email or web address]    Consulted and Agree with Plan of Care Patient           Patient will benefit from skilled therapeutic intervention in order to improve the following deficits and impairments:  Decreased endurance,Decreased activity tolerance,Decreased strength,Increased fascial restricitons,Increased muscle spasms,Decreased coordination  Visit Diagnosis: Muscle weakness (generalized)  Other lack of coordination  Cramp and spasm     Problem List There are no problems to display for this patient.   Eulis Foster, PT 02/21/20 4:53 PM    Outpatient Rehabilitation at [facility] [address], [address] [place], [place], [address] Phone: [phone]   Fax:  [phone]  Name: Gina Mcfarland MRN: [id number] Date of Birth: [birth year]

## 2020-03-06 ENCOUNTER — Encounter: Payer: Self-pay | Admitting: Physical Therapy

## 2020-03-06 ENCOUNTER — Encounter: Payer: Managed Care, Other (non HMO) | Admitting: Physical Therapy

## 2020-03-06 ENCOUNTER — Other Ambulatory Visit: Payer: Self-pay

## 2020-03-06 DIAGNOSIS — R278 Other lack of coordination: Secondary | ICD-10-CM

## 2020-03-06 DIAGNOSIS — R252 Cramp and spasm: Secondary | ICD-10-CM

## 2020-03-06 DIAGNOSIS — M6281 Muscle weakness (generalized): Secondary | ICD-10-CM | POA: Diagnosis not present

## 2020-03-06 NOTE — Therapy (Signed)
[place] [facility] [address], [place], [place], [address] Phone: [phone]   Fax:  [phone]  Physical Therapy Treatment  Patient Details  Name: Gina Mcfarland MRN: [id number] Date of Birth: 10/17/1990 Referring Provider (PT): Dr. Regis Bill   Encounter Date: 03/06/2020   PT End of Session - 03/06/20 1709    Visit Number 5    Date for PT Re-Evaluation 04/10/20    Authorization Type [employer]    PT Start Time 1611   therapist late   PT Stop Time 1658    PT Time Calculation (min) 47 min    Activity Tolerance Patient tolerated treatment well;No increased pain    Behavior During Therapy [facility] for tasks assessed/performed           History reviewed. No pertinent past medical history.  History reviewed. No pertinent surgical history.  There were no vitals filed for this visit.   Subjective Assessment - 03/06/20 1619    Subjective When not away from home get up 3-4 times at night to urinate.    Patient Stated Goals improve sleep by not urinating as much, possible get off medication due to increased urination    Currently in Pain? No/denies              [facility] PT Assessment - 03/06/20 0001      Assessment   Medical Diagnosis R35.0 Urinary frequency; R35.1 Nocturia    Referring Provider (PT) Dr. Sharyn Lull Shroeder    Onset Date/Surgical Date --   6 years ago,   Prior Therapy Seeing a therapist      Precautions   Precautions None      Restrictions   Weight Bearing Restrictions No      Home Environment   Living Environment Private residence      Prior Function   Level of Independence Independent    Vocation Full time employment    Leisure walk her dog, hiking,      Cognition   Overall Cognitive Status Within Functional Limits for tasks assessed      Strength   Right Hip Extension 4/5    Right Hip ABduction 4/5      Palpation   SI assessment  ASIS is equal                       Pelvic Floor Special Questions - 03/06/20 0001    Currently Sexually Active No   in the past did not hurt   Urinary Leakage Yes    Activities that cause leaking Sneezing;Other    Other activities that cause leaking vomiting    Urinary urgency Yes   during the day better, night not easy   Urinary frequency 24 times in 24 hours, if delays during long trip will have increased pain, has to push to have a urine stream, sometimes has a weak urine stream    Fecal incontinence No   Type 4, night has feeling for BM but not able to   Caffeine beverages [place] travel mug in AM, ginger ale in afternoon    Falling out feeling (prolapse) No             [facility] Adult PT Treatment/Exercise - 03/06/20 0001      Self-Care   Self-Care Other Self-Care Comments    Other Self-Care Comments  discussed with patient on different behavioral techniques to use to deter her from needing to urinate when she wakes up in the  middle of the night. and how sleeping in a different place could change how often she may have to urinate      Therapeutic Activites    Therapeutic Activities Other Therapeutic Activities    Other Therapeutic Activities discussed how to breath correctly and positionson the commode that relax the pelvic floor so she is able to urinate and have a bowel movement with greater ease      Lumbar Exercises: Stretches   Other Lumbar Stretch Exercise sit on prickly ball or lacrosse ball to massage the muscles      Manual Therapy   Manual Therapy Soft tissue mobilization;Myofascial release    Soft tissue mobilization manual mobilization to the hip adductors    Myofascial Release using the suction cup along the abdoment  to release the fascia; tissue rolling of the inner thighs; release suprapubically around the bladder                  PT Education - 03/06/20 1708    Education Details toileting, different items to use for massaging    Person(s) Educated Patient    Methods Explanation;Handout     Comprehension Verbalized understanding            PT Short Term Goals - 03/06/20 1626      PT SHORT TERM GOAL #1   Title independent iwth intial stretchs of the hips to elongate the pelvic floor    Time 4    Period Weeks    Status Achieved      PT SHORT TERM GOAL #2   Title able to perform diaphragmatic breathing to open up the pelvic floor to reduce tightness    Time 4    Period Weeks    Status Achieved      PT SHORT TERM GOAL #3   Title able to expand the lower ribe cage adn open up the back to move the diaphragm fully    Time 4    Period Weeks    Status Achieved             PT Long Term Goals - 03/06/20 1626      PT LONG TERM GOAL #1   Title independent with advanced HEP    Time 12    Period Weeks    Status On-going      PT LONG TERM GOAL #2   Title reduce the need to urinate at night to 2 times weekly compared to 5 times per night due to the ability to relax the pelvic floor    Time 12    Period Weeks    Status On-going      PT LONG TERM GOAL #3   Title able to wait 3 or more hours to urinate during the day due to using behavoral techniques    Time 12    Period Weeks    Status On-going      PT LONG TERM GOAL #4   Title pelvic floro coordination improved and strength >/= 3/5 so she is not leaking urine with coughing and vomiting    Time 12    Period Weeks    Status On-going                 Plan - 03/06/20 1710    Clinical Impression Statement Patient will wake up 3-4 times at night when she is at her parents compared to 2 times when she is home. Patient sleeps better when she is in her own enviroment  and able to fall back to sleep easier when at home. Patient was able to relax her pelvic floor after manual work was done on her abdomen and inner thigh. Patient is able to have a bowel movement or urinate easier when her feet are elevated and does her diaphragmatic breathing. Patient has met all of her STG's. Patient will benefit from skilled  therapy to elongate the pelvic floor to decrease tone and irritation to reduce urge to void.    Personal Factors and Comorbidities Time since onset of injury/illness/exacerbation    Examination-Activity Limitations Sleep;Continence    Stability/Clinical Decision Making Stable/Uncomplicated    Rehab Potential Excellent    PT Frequency 1x / week    PT Duration 12 weeks    PT Treatment/Interventions ADLs/Self Care Home Management;Biofeedback;Electrical Stimulation;Moist Heat;Ultrasound;Therapeutic activities;Therapeutic exercise;Neuromuscular re-education;Patient/family education;Manual techniques;Dry needling;Spinal Manipulations    PT Next Visit Plan see patient on 4/7, see how she is doing with her HEP, manual work to the abdomen and inner thigh, see if she needs to continue    PT Home Exercise Plan Access Code: [email or web address]    Consulted and Agree with Plan of Care Patient           Patient will benefit from skilled therapeutic intervention in order to improve the following deficits and impairments:  Decreased endurance,Decreased activity tolerance,Decreased strength,Increased fascial restricitons,Increased muscle spasms,Decreased coordination  Visit Diagnosis: Muscle weakness (generalized)  Other lack of coordination  Cramp and spasm     Problem List There are no problems to display for this patient.   Earlie Counts, PT 03/06/20 5:18 PM     Outpatient Rehabilitation at [facility] [address] [place], [place], [address] Phone: [phone]   Fax:  [phone]  Name: Gina Mcfarland MRN: [id number] Date of Birth: [birth year]

## 2020-03-06 NOTE — Patient Instructions (Addendum)
Toileting Techniques for Bowel Movements (Defecation) Using your belly (abdomen) and pelvic floor muscles to have a bowel movement is usually instinctive.  Sometimes people can have problems with these muscles and have to relearn proper defecation (emptying) techniques.  If you have weakness in your muscles, organs that are falling out, decreased sensation in your pelvis, or ignore your urge to go, you may find yourself straining to have a bowel movement.  You are straining if you are: . holding your breath or taking in a huge gulp of air and holding it  . keeping your lips and jaw tensed and closed tightly . turning red in the face because of excessive pushing or forcing . developing or worsening your  hemorrhoids . getting faint while pushing . not emptying completely and have to defecate many times a day  If you are straining, you are actually making it harder for yourself to have a bowel movement.  Many people find they are pulling up with the pelvic floor muscles and closing off instead of opening the anus. Due to lack pelvic floor relaxation and coordination the abdominal muscles, one has to work harder to push the feces out.  Many people have never been taught how to defecate efficiently and effectively.  Notice what happens to your body when you are having a bowel movement.  While you are sitting on the toilet pay attention to the following areas: . Jaw and mouth position . Angle of your hips   . Whether your feet touch the ground or not . Arm placement  . Spine position . Waist . Belly tension . Anus (opening of the anal canal)  An Evacuation/Defecation Plan   Here are the 4 basic points:  1. Lean forward enough for your elbows to rest on your knees 2. Support your feet on the floor or use a low stool if your feet don't touch the floor  3. Push out your belly as if you have swallowed a beach ball--you should feel a widening of your waist 4. Open and relax your pelvic floor  muscles, rather than tightening around the anus       The following conditions my require modifications to your toileting posture:  . If you have had surgery in the past that limits your back, hip, pelvic, knee or ankle flexibility . Constipation   Your healthcare practitioner may make the following additional suggestions and adjustments:  1) Sit on the toilet  a) Make sure your feet are supported. b) Notice your hip angle and spine position--most people find it effective to lean forward or raise their knees, which can help the muscles around the anus to relax  c) When you lean forward, place your forearms on your thighs for support  2) Relax suggestions a) Breath deeply in through your nose and out slowly through your mouth as if you are smelling the flowers and blowing out the candles. b) To become aware of how to relax your muscles, contracting and releasing muscles can be helpful.  Pull your pelvic floor muscles in tightly by using the image of holding back gas, or closing around the anus (visualize making a circle smaller) and lifting the anus up and in.  Then release the muscles and your anus should drop down and feel open. Repeat 5 times ending with the feeling of relaxation. c) Keep your pelvic floor muscles relaxed; let your belly bulge out. d) The digestive tract starts at the mouth and ends at the anal opening, so  be sure to relax both ends of the tube.  Place your tongue on the roof of your mouth with your teeth separated.  This helps relax your mouth and will help to relax the anus at the same time.  3) Empty (defecation) a) Keep your pelvic floor and sphincter relaxed, then bulge your anal muscles.  Make the anal opening wide.  b) Stick your belly out as if you have swallowed a beach ball. c) Make your belly wall hard using your belly muscles while continuing to breathe. Doing this makes it easier to open your anus. d) Breath out and give a grunt (or try using other sounds  such as ahhhh, shhhhh, ohhhh or grrrrrrr).  4) Finish a) As you finish your bowel movement, pull the pelvic floor muscles up and in.  This will leave your anus in the proper place rather than remaining pushed out and down. If you leave your anus pushed out and down, it will start to feel as though that is normal and give you incorrect signals about needing to have a bowel movement.     Pricly massage ball to massage the pelvic floor  Suction cup massage Gina Mcfarland, PT [facility] [address], [address] [place], [place] [address] W: [phone] [email or web address] [email or web address]

## 2020-03-14 ENCOUNTER — Ambulatory Visit: Payer: Managed Care, Other (non HMO) | Admitting: Physical Therapy

## 2020-03-16 ENCOUNTER — Emergency Department (HOSPITAL_COMMUNITY): Payer: Managed Care, Other (non HMO)

## 2020-03-16 ENCOUNTER — Other Ambulatory Visit: Payer: Self-pay

## 2020-03-16 ENCOUNTER — Ambulatory Visit: Payer: Managed Care, Other (non HMO) | Admitting: Family Medicine

## 2020-03-16 ENCOUNTER — Emergency Department (HOSPITAL_COMMUNITY)
Admission: EM | Admit: 2020-03-16 | Discharge: 2020-03-16 | Disposition: A | Discharge status: Home / Self Care | Payer: Managed Care, Other (non HMO) | Attending: Emergency Medicine | Admitting: Emergency Medicine

## 2020-03-16 ENCOUNTER — Encounter: Payer: Self-pay | Admitting: Family Medicine

## 2020-03-16 ENCOUNTER — Encounter (HOSPITAL_COMMUNITY): Payer: Self-pay | Admitting: Pharmacy Technician

## 2020-03-16 VITALS — BP 120/72 | HR 110 | Temp 98.0°F | Wt 113.0 lb

## 2020-03-16 DIAGNOSIS — R1031 Right lower quadrant pain: Secondary | ICD-10-CM | POA: Diagnosis not present

## 2020-03-16 DIAGNOSIS — R10823 Right lower quadrant rebound abdominal tenderness: Secondary | ICD-10-CM | POA: Diagnosis not present

## 2020-03-16 DIAGNOSIS — R102 Pelvic and perineal pain: Secondary | ICD-10-CM

## 2020-03-16 LAB — WET PREP, GENITAL
Clue Cells Wet Prep HPF POC: NONE SEEN
Sperm: NONE SEEN
Trich, Wet Prep: NONE SEEN
Yeast Wet Prep HPF POC: NONE SEEN

## 2020-03-16 LAB — COMPREHENSIVE METABOLIC PANEL
ALT: 17 U/L (ref 0–44)
AST: 26 U/L (ref 15–41)
Albumin: 4.3 g/dL (ref 3.5–5.0)
Alkaline Phosphatase: 54 U/L (ref 38–126)
Anion gap: 9 (ref 5–15)
BUN: 9 mg/dL (ref 6–20)
CO2: 26 mmol/L (ref 22–32)
Calcium: 9.7 mg/dL (ref 8.9–10.3)
Chloride: 102 mmol/L (ref 98–111)
Creatinine, Ser: 0.7 mg/dL (ref 0.44–1.00)
GFR, Estimated: >60 mL/min (ref >60)
Glucose, Bld: 88 mg/dL (ref 70–99)
Potassium: 3.9 mmol/L (ref 3.5–5.1)
Sodium: 137 mmol/L (ref 135–145)
Total Bilirubin: 0.9 mg/dL (ref 0.3–1.2)
Total Protein: 7.7 g/dL (ref 6.5–8.1)

## 2020-03-16 LAB — URINALYSIS, ROUTINE W REFLEX MICROSCOPIC
Bilirubin Urine: NEGATIVE
Glucose, UA: NEGATIVE mg/dL
Ketones, ur: 20 mg/dL — AB
Leukocytes,Ua: NEGATIVE
Nitrite: NEGATIVE
Protein, ur: NEGATIVE mg/dL
Specific Gravity, Urine: 1.019 (ref 1.005–1.030)
pH: 7 (ref 5.0–8.0)

## 2020-03-16 LAB — I-STAT BETA HCG BLOOD, ED (MC, WL, AP ONLY): I-stat hCG, quantitative: <5 m[IU]/mL (ref <5)

## 2020-03-16 LAB — CBC
HCT: 40.9 % (ref 36.0–46.0)
Hemoglobin: 13.5 g/dL (ref 12.0–15.0)
MCH: 30.7 pg (ref 26.0–34.0)
MCHC: 33 g/dL (ref 30.0–36.0)
MCV: 93 fL (ref 80.0–100.0)
Platelets: 324 10*3/uL (ref 150–400)
RBC: 4.4 MIL/uL (ref 3.87–5.11)
RDW: 12.6 % (ref 11.5–15.5)
WBC: 9.1 10*3/uL (ref 4.0–10.5)
nRBC: 0 % (ref 0.0–0.2)

## 2020-03-16 LAB — LIPASE, BLOOD: Lipase: 27 U/L (ref 11–51)

## 2020-03-16 MED ORDER — IOHEXOL 300 MG/ML  SOLN
100.0000 mL | Freq: Once | INTRAMUSCULAR | Status: AC | PRN
  Administered 2020-03-16: 100 mL via INTRAVENOUS

## 2020-03-16 MED ORDER — ONDANSETRON HCL 4 MG/2ML IJ SOLN
4.0000 mg | Freq: Once | INTRAMUSCULAR | Status: AC
  Administered 2020-03-16: 4 mg via INTRAVENOUS
  Filled 2020-03-16: qty 2

## 2020-03-16 MED ORDER — KETOROLAC TROMETHAMINE 15 MG/ML IJ SOLN
15.0000 mg | Freq: Once | INTRAMUSCULAR | Status: AC
  Administered 2020-03-16: 15 mg via INTRAVENOUS
  Filled 2020-03-16: qty 1

## 2020-03-16 MED ORDER — SODIUM CHLORIDE 0.9 % IV BOLUS
1000.0000 mL | Freq: Once | INTRAVENOUS | Status: AC
  Administered 2020-03-16: 1000 mL via INTRAVENOUS

## 2020-03-16 MED ORDER — FENTANYL CITRATE (PF) 100 MCG/2ML IJ SOLN
75.0000 ug | Freq: Once | INTRAMUSCULAR | Status: DC
Start: 2020-03-16 — End: 2020-03-16

## 2020-03-16 MED ORDER — FENTANYL CITRATE (PF) 100 MCG/2ML IJ SOLN
75.0000 ug | Freq: Once | INTRAMUSCULAR | Status: AC
Start: 2020-03-16 — End: 2020-03-16
  Administered 2020-03-16: 75 ug via INTRAVENOUS
  Filled 2020-03-16: qty 2

## 2020-03-16 NOTE — ED Triage Notes (Signed)
Pt here with reports of RLQ abd pain onset 2 days ago. States pain is getting worse. Went to PCP and was sent here to R/O appendicitis. Pt denies NVD or fevers.

## 2020-03-16 NOTE — ED Notes (Signed)
Patient transported to CT 

## 2020-03-16 NOTE — ED Provider Notes (Signed)
[facility] EMERGENCY DEPARTMENT Provider Note   CSN: [id number] Arrival date & time: 03/16/20  1017     History Chief Complaint  Patient presents with  . Abdominal Pain    Gina Mcfarland is a 30 y.o. female.  HPI   Patient with no significant medical history presents to the emergency department with chief complaint of right lower quadrant pain.  Patient endorses pain started suddenly 2 days ago, she describes this is a dull sensation which she feels in her right lower quadrant and then will move down her leg.  She has no associated nausea, vomiting, diarrhea, no urinary symptoms, denies flank pain or back pain, vaginal discharge or vaginal bleeding.  Last menstrual cycle was 3 weeks ago.  She has no significant surgical history, no history of kidney stones, no history of ovarian torsion.  She states the pain has been getting worse, is not worsened with food but endorse that moving her right leg makes the pain worse.  She has never experienced this in the past.  She was seen at her primary care office today where they are concerned for possible appendicitis.  Patient denies headaches, fevers, chills, shortness of breath, chest pain, urinary symptoms, worsening pedal edema.  History reviewed. No pertinent past medical history.  There are no problems to display for this patient.   History reviewed. No pertinent surgical history.   OB History    Gravida  0   Para  0   Term  0   Preterm  0   AB  0   Living  0     SAB  0   IAB  0   Ectopic  0   Multiple  0   Live Births  0           Family History  Problem Relation Age of Onset  . Colon cancer Maternal Grandfather   . Pancreatic cancer Paternal Grandmother     Social History   Tobacco Use  . Smoking status: Never Smoker  . Smokeless tobacco: Never Used  Vaping Use  . Vaping Use: Never used  Substance Use Topics  . Alcohol use: Yes    Comment: 3 glasses of wine   . Drug use: Not  Currently    Types: Marijuana    Comment: hx of marjuina    Home Medications Prior to Admission medications   Medication Sig Start Date End Date Taking? Authorizing Provider  norethindrone-ethinyl estradiol (PIRMELLA 7/7/7) 0.5/0.75/1-35 MG-MCG tablet Take 1 tablet by mouth daily. 11/22/19   [name], NP  oxybutynin (DITROPAN-XL) 10 MG 24 hr tablet Take 1 tablet (10 mg total) by mouth at bedtime. 02/02/20   Henson, Vickie L, NP-C  propranolol (INDERAL) 10 MG tablet TAKE 1 TABLET BY MOUTH EVERY DAY Patient not taking: Reported on 03/16/2020 02/13/20   Avanell Shackleton, NP-C    Allergies    Patient has no known allergies.  Review of Systems   Review of Systems  Constitutional: Negative for chills and fever.  HENT: Negative for congestion.   Respiratory: Negative for shortness of breath.   Cardiovascular: Negative for chest pain.  Gastrointestinal: Positive for abdominal pain and constipation. Negative for diarrhea, nausea and vomiting.  Genitourinary: Negative for decreased urine volume, difficulty urinating, enuresis, flank pain, vaginal bleeding, vaginal discharge and vaginal pain.  Musculoskeletal: Negative for back pain.  Skin: Negative for rash.  Neurological: Negative for dizziness and headaches.  Hematological: Does not bruise/bleed easily.  Physical Exam Updated Vital Signs BP 112/63   Pulse 80   Temp 98.3 F (36.8 C) (Oral)   Resp 16   LMP 02/24/2020   SpO2 98%   Physical Exam Vitals and nursing note reviewed. Exam conducted with a chaperone present.  Constitutional:      General: She is not in acute distress.    Appearance: Normal appearance. She is not ill-appearing.  HENT:     Head: Normocephalic and atraumatic.     Nose: No congestion or rhinorrhea.  Eyes:     Conjunctiva/sclera: Conjunctivae normal.  Cardiovascular:     Rate and Rhythm: Normal rate and regular rhythm.     Pulses: Normal pulses.     Heart sounds: No murmur heard. No friction  rub. No gallop.   Pulmonary:     Effort: No respiratory distress.     Breath sounds: No stridor. No wheezing, rhonchi or rales.  Abdominal:     General: There is no distension.     Palpations: Abdomen is soft.     Tenderness: There is abdominal tenderness. There is rebound. There is no right CVA tenderness, left CVA tenderness or guarding.     Comments: Patient's abdomen is visualized, is nondistended, normoactive bowel sounds, dull to percussion.  She was tender to palpation in her right lower quadrant, positive rebound tenderness, positive McBurney point, negative Murphy or peritoneal sign.  No CVA tenderness  Genitourinary:    Comments: Pelvic exam performed, exterior genitalia was examined there was no lesions, rashes or discharge noted.  Vaginal canal was patent, pink, old blood in the vaginal vault, no lesions or trauma noted.  Cervix was visualized there was no lesions, or other abnormalities noted.  Patient noted right adnexal pain, negative left or chandelier sign present.  Patient was uncomfortable during exam. Musculoskeletal:     Right lower leg: No edema.     Left lower leg: No edema.  Skin:    General: Skin is warm and dry.  Neurological:     Mental Status: She is alert.  Psychiatric:        Mood and Affect: Mood normal.     ED Results / Procedures / Treatments   Labs (all labs ordered are listed, but only abnormal results are displayed) Labs Reviewed  WET PREP, GENITAL - Abnormal; Notable for the following components:      Result Value   WBC, Wet Prep HPF POC MODERATE (*)    All other components within normal limits  URINALYSIS, ROUTINE W REFLEX MICROSCOPIC - Abnormal; Notable for the following components:   Hgb urine dipstick SMALL (*)    Ketones, ur 20 (*)    Bacteria, UA RARE (*)    All other components within normal limits  LIPASE, BLOOD  COMPREHENSIVE METABOLIC PANEL  CBC  I-STAT BETA HCG BLOOD, ED ([facility], [facility], [facility] ONLY)  GC/CHLAMYDIA PROBE AMP ([place])  NOT AT [facility]    EKG None  Radiology CT Abdomen Pelvis W Contrast  Result Date: 03/16/2020 CLINICAL DATA:  RIGHT lower quadrant abdominal pain question appendicitis EXAM: CT ABDOMEN AND PELVIS WITH CONTRAST TECHNIQUE: Multidetector CT imaging of the abdomen and pelvis was performed using the standard protocol following bolus administration of intravenous contrast. Sagittal and coronal MPR images reconstructed from axial data set. CONTRAST:  OMNIPAQUE IOHEXOL 300 MG/ML SOLN IV. No oral contrast administered. COMPARISON:  None FINDINGS: Lower chest: Lung bases clear Hepatobiliary: Gallbladder and liver normal appearance Pancreas: Normal appearance Spleen: Normal appearance Adrenals/Urinary Tract:  Adrenal glands, kidneys, ureters, and bladder normal appearance Stomach/Bowel: Normal appendix. Stomach and bowel loops normal appearance. Vascular/Lymphatic: Vascular structures patent. No definite adenopathy. Reproductive: Patchy enhancement of uterus, may represent heterogeneous myometrial enhancement but cannot exclude small leiomyomata. Unremarkable adnexa. Other: Small amount of nonspecific free pelvic fluid, potentially physiologic. No free air. No hernia. No definite inflammatory process seen. Musculoskeletal: Unremarkable IMPRESSION: Normal appendix. No acute intra-abdominal or intrapelvic abnormalities identified. Electronically Signed   By: Ulyses Southward M.D.   On: 03/16/2020 12:18   US PELVIC COMPLETE W TRANSVAGINAL AND TORSION R/O  Result Date: 03/16/2020 CLINICAL DATA:  Pelvic pain X 2 days. EXAM: TRANSABDOMINAL AND TRANSVAGINAL ULTRASOUND OF PELVIS DOPPLER ULTRASOUND OF OVARIES TECHNIQUE: Both transabdominal and transvaginal ultrasound examinations of the pelvis were performed. Transabdominal technique was performed for global imaging of the pelvis including uterus, ovaries, adnexal regions, and pelvic cul-de-sac. It was necessary to proceed with endovaginal exam following the transabdominal exam  to visualize the uterus, endometrium, bilateral ovaries, bilateral adnexa. Color and duplex Doppler ultrasound was utilized to evaluate blood flow to the ovaries. COMPARISON:  CT abdomen pelvis 03/16/2020 FINDINGS: Uterus Measurements: 6.4 x 2.4 x 4.5 cm = volume: 36 mL. No fibroids or other mass visualized. Endometrium Thickness: 1 mm.  No focal abnormality visualized. Right ovary Measurements: 3.8 x 1.6 x 3.1 cm = volume: 9.6 mL. A cystic lesion measuring 1.3 x 1.1 x 1.1 cm is simple in appearance and likely represents abdominal follicle. Normal appearance/no adnexal mass. Left ovary Measurements: 3.4 x 1.5 x 2.3 cm = volume: 5.9 mL. Normal appearance/no adnexal mass. Pulsed Doppler evaluation of both ovaries demonstrates normal low-resistance arterial and venous waveforms. Other findings No abnormal free fluid. IMPRESSION: Unremarkable pelvic ultrasound. Electronically Signed   By: Tish Frederickson M.D.   On: 03/16/2020 15:02    Procedures Pelvic exam  Date/Time: 03/16/2020 1:27 PM Performed by: Carroll Sage, PA-C Authorized by: Carroll Sage, PA-C  Patient identity confirmed: verbally with patient Preparation: Patient was prepped and draped in the usual sterile fashion. Local anesthesia used: no  Anesthesia: Local anesthesia used: no  Sedation: Patient sedated: no  Patient tolerance: patient tolerated the procedure well with no immediate complications      Medications Ordered in ED Medications  fentaNYL (SUBLIMAZE) injection 75 mcg (75 mcg Intravenous Patient Refused/Not Given 03/16/20 1512)  ketorolac (TORADOL) 15 MG/ML injection 15 mg (has no administration in time range)  fentaNYL (SUBLIMAZE) injection 75 mcg (75 mcg Intravenous Given 03/16/20 1106)  ondansetron (ZOFRAN) injection 4 mg (4 mg Intravenous Given 03/16/20 1104)  sodium chloride 0.9 % bolus 1,000 mL (0 mLs Intravenous Stopped 03/16/20 1201)  iohexol (OMNIPAQUE) 300 MG/ML solution 100 mL (100 mLs Intravenous  Contrast Given 03/16/20 1148)    ED Course  I have reviewed the triage vital signs and the nursing notes.  Pertinent labs & imaging results that were available during my care of the patient were reviewed by me and considered in my medical decision making (see chart for details).  Clinical Course as of 03/16/20 1520  Fri Mar 16, 2020  11029 Healthy 30 year old female here with 2 days of right lower quadrant abdominal pain.  Worse with bending and movement.  Saw PCP and sent over for possible appendicitis.  No fevers normal appetite.  On exam has focal right lower quadrant tenderness and some rebound.  Getting labs CT abdomen pelvis.  Last menstrual period 3 weeks ago normal. [MB]    Clinical Course User Index [MB] Charm Barges,  Kayleen MemosMichael C, MD   MDM Rules/Calculators/A&P                          Initial impression-patient presents with right lower quadrant pain.  She is alert does not appear in acute distress.  Vital signs reassuring.  Concern for possible appendicitis versus ovarian torsion.  Will obtain basic lab work, abdominal scan and reevaluate.  Work-up-CBC and CMP unremarkable.  Lipase 27, UA negative for signs of infection.  Urine pregnancy negative.  Wet prep shows white blood cells.  CT abdomen pelvis negative for acute findings.  Ultrasound negative for ovarian torsion, does show that she has a simple abdominal cyst in the right side.  Reassessment patient was reassessed after providing her with fentanyl, states her pain has improved but continues to have right lower quadrant pain upon palpation.  Discussed with patient concern for possible ovarian torsion and would recommend pelvic exam and transvaginal ultrasound.  Patient was agreeable to this.   Rule out-I have low suspicion for ectopic pregnancy as urine pregnancy is negative.  Low suspicion for ovarian torsion as ultrasound is negative.  Does show a cystic lesion simple in nature likely a follicle, I suspect this is was causing her  right lower abdominal pain..  Low suspicion for PID as patient denies vaginal pain, vaginal discharge or vaginal bleeding, on pelvic exam there is no noted discharge present, negative chandelier sign present.  low suspicion for pyelonephritis, kidney stones, UTI as she denies flank pain, no CVA tenderness on my exam, UA is negative for signs of infection, no hematuria present.  Low suspicion for appendicitis as CT imaging negative for acute findings, lab work is reassuring no signs of infection present.  Low suspicion for AAA as history is atypical, patient has low risk factors, no bulging mass on my exam, no aneurysm present on CT imaging.  Plan-suspect patient suffering from a ovarian cyst causing her right lower quadrant pain, will recommend NSAIDs and following up with OB/GYN for further evaluation if needed.  Vital signs have remained stable, no indication for hospital admission.  Patient given at home care as well strict return precautions.  Patient verbalized that they understood agreed to said plan.   Final Clinical Impression(s) / ED Diagnoses Final diagnoses:  Right lower quadrant abdominal pain    Rx / DC Orders ED Discharge Orders    None       Carroll SageFaulkner, Ornella Coderre J, PA-C 03/16/20 1520    Terrilee FilesButler, Michael C, MD 03/16/20 [phone]

## 2020-03-16 NOTE — Patient Instructions (Signed)
Suspect patient has acute appendicitis.  RLQ pain that is worsening. RLQ with marked TTP and rebound tenderness.  Please work her up soon as possible.

## 2020-03-16 NOTE — Progress Notes (Signed)
   Subjective:    Patient ID: Gina Mcfarland, female    DOB: September 12, 1990, 30 y.o.   MRN: [id number]  HPI Chief Complaint  Patient presents with  . other    Lower stomach pain rt. Side lower abdomen started past two days   Complains of a 2 day history of RLQ pain that is worsening. States pain improves with rest but is severe with any type of movement including the car ride here.  Denies injury.  No recent muscle strain.  Denies fever, chills, dizziness, chest pain, palpitations, shortness of breath, nausea, vomiting or diarrhea.  She does report having a looser bowel movement than usual this morning.  No blood.   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    Denies history of ovarian cyst.  Normal Pap smear in the past 4 months.  LMP: 02/24/2020  Reviewed allergies, medications, past medical, surgical, family, and social history.     Review of Systems Pertinent positives and negatives in the history of present illness.     Objective:   Physical Exam Constitutional:      General: She is in acute distress.  Eyes:     Conjunctiva/sclera: Conjunctivae normal.  Cardiovascular:     Rate and Rhythm: Tachycardia present.     Pulses: Normal pulses.  Pulmonary:     Effort: Pulmonary effort is normal.  Abdominal:     General: Abdomen is flat. Bowel sounds are decreased.     Tenderness: There is abdominal tenderness in the right lower quadrant. There is rebound. Positive signs include Rovsing's sign, McBurney's sign and psoas sign.  Musculoskeletal:     Cervical back: Normal range of motion and neck supple.  Skin:    General: Skin is warm.  Neurological:     General: No focal deficit present.     Mental Status: She is alert and oriented to person, place, and time.    BP 120/72   Pulse (!) 110   Temp 98 F (36.7 C)   Wt 113 lb (51.3 kg)   LMP 02/24/2020   BMI 22.82 kg/m        Assessment & Plan:  RLQ abdominal  pain  Right lower quadrant abdominal tenderness with rebound tenderness  Patient is in significant pain and her exam is highly suspicious for acute appendicitis.  She will go via private vehicle to the [facility] emergency department for further evaluation.  Dr. Susann Givens also examined patient

## 2020-03-16 NOTE — Discharge Instructions (Addendum)
I suspect your right lower quadrant pain is secondary due to ovarian cyst seen on your ultrasound.  I recommend NSAIDs as needed for pain management, please read the back of bottle and follow proper dosing.  You may also apply heating pad to the area and sitting in warm water this can help alleviate your pain.  I would follow-up with your OB/GYN within the next couple weeks for reevaluation if needed.  Come back to the emergency department if you develop chest pain, shortness of breath, severe abdominal pain, uncontrolled nausea, vomiting, diarrhea.

## 2020-03-16 NOTE — ED Notes (Addendum)
Pt returned from US

## 2020-03-19 LAB — GC/CHLAMYDIA PROBE AMP (~~LOC~~) NOT AT ARMC
Chlamydia: NEGATIVE
Comment: NEGATIVE
Comment: NORMAL
Neisseria Gonorrhea: NEGATIVE

## 2020-04-19 ENCOUNTER — Ambulatory Visit: Payer: Managed Care, Other (non HMO) | Attending: Obstetrics and Gynecology | Admitting: Physical Therapy

## 2020-04-19 ENCOUNTER — Other Ambulatory Visit: Payer: Self-pay

## 2020-04-19 ENCOUNTER — Encounter: Payer: Self-pay | Admitting: Physical Therapy

## 2020-04-19 DIAGNOSIS — R278 Other lack of coordination: Secondary | ICD-10-CM | POA: Insufficient documentation

## 2020-04-19 DIAGNOSIS — R252 Cramp and spasm: Secondary | ICD-10-CM | POA: Insufficient documentation

## 2020-04-19 DIAGNOSIS — M6281 Muscle weakness (generalized): Secondary | ICD-10-CM | POA: Insufficient documentation

## 2020-04-19 NOTE — Therapy (Signed)
[facility] [address] [place], [place], [address] Phone: [phone]   Fax:  [phone]  Physical Therapy Treatment/Discharge  Patient Details  Name: Gina Mcfarland MRN: [id number] Date of Birth: [birth year] Referring Provider (PT): Dr. Regis Bill   Encounter Date: 04/19/2020   PT End of Session - 04/19/20 1609    Visit Number 6    Authorization Type [employer]    PT Start Time 5885    PT Stop Time 0277    PT Time Calculation (min) 38 min    Activity Tolerance Patient tolerated treatment well;No increased pain    Behavior During Therapy [facility] for tasks assessed/performed           History reviewed. No pertinent past medical history.  History reviewed. No pertinent surgical history.  There were no vitals filed for this visit.   Subjective Assessment - 04/19/20 1535    Subjective I had an ovarian cyst 1 month ago. I feel it is better and had 2 cycles. Since the cyst was doing better I am able to resume my routine. Hold urine longer and not getting up as often at night.    Patient Stated Goals improve sleep by not urinating as much, possible get off medication due to increased urination    Currently in Pain? No/denies              [facility] PT Assessment - 04/19/20 0001      Assessment   Medical Diagnosis R35.0 Urinary frequency; R35.1 Nocturia    Referring Provider (PT) Dr. Sharyn Lull Shroeder    Onset Date/Surgical Date --   6 years ago,   Prior Therapy Seeing a therapist      Precautions   Precautions None      Restrictions   Weight Bearing Restrictions No      Home Environment   Living Environment Private residence      Prior Function   Level of Independence Independent    Vocation Full time employment    Leisure walk her dog, hiking,      Cognition   Overall Cognitive Status Within Functional Limits for tasks assessed      Strength   Right Hip Extension 5/5    Right Hip ABduction 5/5       Palpation   SI assessment  ASIS is equal                      Pelvic Floor Special Questions - 04/19/20 0001    Urinary Leakage No    Exam Type Deferred             [facility] Adult PT Treatment/Exercise - 04/19/20 0001      Lumbar Exercises: Supine   Dead Bug 10 reps;1 second    Dead Bug Limitations each side , tactile cues to not lift opposite hip    Other Supine Lumbar Exercises diaphramatic breathing for 15 x    Other Supine Lumbar Exercises happy baby x 1 min.      Lumbar Exercises: Quadruped   Opposite Arm/Leg Raise Right arm/Left leg;Left arm/Right leg;10 reps;1 second    Opposite Arm/Leg Raise Limitations tactile cues to keep spinal neutral    Other Quadruped Lumbar Exercises hinge and keeping spinal neutral                  PT Education - 04/19/20 1608    Education Details Access Code: [email or web address]    Person(s) Educated Patient  Methods Explanation;Demonstration;Verbal cues;Handout    Comprehension Returned demonstration;Verbalized understanding            PT Short Term Goals - 04/19/20 1613      PT SHORT TERM GOAL #1   Title independent iwth intial stretchs of the hips to elongate the pelvic floor    Time 4    Period Weeks    Status Achieved      PT SHORT TERM GOAL #2   Title able to perform diaphragmatic breathing to open up the pelvic floor to reduce tightness    Time 4    Period Weeks    Status Achieved      PT SHORT TERM GOAL #3   Title able to expand the lower ribe cage adn open up the back to move the diaphragm fully    Time 4    Period Weeks    Status Achieved             PT Long Term Goals - 04/19/20 1537      PT LONG TERM GOAL #1   Title independent with advanced HEP    Time 12    Period Weeks    Status Achieved      PT LONG TERM GOAL #2   Title reduce the need to urinate at night to 2 times weekly compared to 5 times per night due to the ability to relax the pelvic floor    Baseline wakes up 2 times per day     Time 12    Period Weeks    Status Achieved      PT LONG TERM GOAL #3   Title able to wait 3 or more hours to urinate during the day due to using behavoral techniques    Baseline can wait longer when distracted    Time 12    Period Weeks    Status Achieved      PT LONG TERM GOAL #4   Title pelvic floro coordination improved and strength >/= 3/5 so she is not leaking urine with coughing and vomiting    Time 12    Period Weeks    Status Achieved                 Plan - 04/19/20 1610    Clinical Impression Statement Patient is independent with her HEP. She is not having urinary leakage. She is able to wake up 2 times per night starting at 4 AM. Patient is able to wait to urinate. Patient has met her goals for therapy and feels comfortable with being discharged. She has full strength of hips and pelvis in correct alignment. Today was an appoinment to review her HEP and see if she is to continue but she will not be.    Personal Factors and Comorbidities Time since onset of injury/illness/exacerbation    Examination-Activity Limitations Sleep;Continence    Stability/Clinical Decision Making Stable/Uncomplicated    Rehab Potential Excellent    PT Frequency 1x / week    PT Duration --   for this visit to review HEP and be discharged   PT Treatment/Interventions ADLs/Self Care Home Management;Biofeedback;Electrical Stimulation;Moist Heat;Ultrasound;Therapeutic activities;Therapeutic exercise;Neuromuscular re-education;Patient/family education;Manual techniques;Dry needling;Spinal Manipulations    PT Next Visit Plan Discharge to HEP    PT Home Exercise Plan Access Code: [email or web address]    Consulted and Agree with Plan of Care Patient           Patient will benefit from skilled therapeutic intervention in order to improve the  following deficits and impairments:  Decreased endurance,Decreased activity tolerance,Decreased strength,Increased fascial restricitons,Increased muscle  spasms,Decreased coordination  Visit Diagnosis: Muscle weakness (generalized) - Plan: PT plan of care cert/re-cert  Other lack of coordination - Plan: PT plan of care cert/re-cert  Cramp and spasm - Plan: PT plan of care cert/re-cert     Problem List There are no problems to display for this patient.   Earlie Counts, PT 04/19/20 4:17 PM   [facility] [address] [place], [place], [address] Phone: [phone]   Fax:  [phone]  Name: Gina Mcfarland MRN: [id number] Date of Birth: Jul 15, 1990  PHYSICAL THERAPY DISCHARGE SUMMARY  Visits from Start of Care: 6  Current functional level related to goals / functional outcomes: See above.   Remaining deficits: See above.    Education / Equipment: HEP Plan: Patient agrees to discharge.  Patient goals were met. Patient is being discharged due to meeting the stated rehab goals. Thank you for the referral. Earlie Counts, PT 04/19/20 4:18 PM  ?????

## 2020-04-19 NOTE — Patient Instructions (Signed)
Access Code: [email or web address] URL: [email or web address] Date: 04/19/2020 Prepared by: Eulis Foster  Program Notes sit on a tennis ball and roll back and forth to massage the pelvic floor   Exercises Supine Diaphragmatic Breathing - 1 x daily - 3 x weekly - 1 sets - 10 reps Supine Pelvic Floor Stretch - 1 x daily - 3 x weekly - 1 sets - 1 reps - 1 min hold Hip Hinge Rock Back - 1 x daily - 3 x weekly - 1 sets - 10 reps Deep Squat with Pelvic Floor Relaxation - 1 x daily - 3 x weekly - 1 sets - 1 reps - 30-60 sec hold Pigeon Pose - 1 x daily - 3 x weekly - 1 sets - 1 reps - 30 sec hold Quadruped Pelvic Floor Contraction with Opposite Arm and Leg Lift - 1 x daily - 3 x weekly - 3 sets - 10 reps Dead Bug - 1 x daily - 3 x weekly - 2 sets - 10 reps Side Plank on Knees - 1 x daily - 3 x weekly - 1 sets - 5 reps - 5 sec hold Plank on Knees - 1 x daily - 3 x weekly - 1 sets - 5 reps [facility] [address] [place], [place] [address] Phone # [phone] Fax [phone]

## 2020-05-23 ENCOUNTER — Other Ambulatory Visit: Payer: Self-pay | Admitting: Family Medicine

## 2020-05-23 DIAGNOSIS — N3281 Overactive bladder: Secondary | ICD-10-CM

## 2020-05-23 NOTE — Telephone Encounter (Signed)
Ok to refill 

## 2020-08-27 ENCOUNTER — Other Ambulatory Visit: Payer: Self-pay | Admitting: Family Medicine

## 2020-08-27 DIAGNOSIS — N3281 Overactive bladder: Secondary | ICD-10-CM

## 2020-11-18 ENCOUNTER — Other Ambulatory Visit: Payer: Self-pay | Admitting: Nurse Practitioner

## 2020-11-18 DIAGNOSIS — Z3041 Encounter for surveillance of contraceptive pills: Secondary | ICD-10-CM

## 2020-11-19 NOTE — Telephone Encounter (Signed)
Annual exam scheduled on 11/22/20 

## 2020-11-22 ENCOUNTER — Ambulatory Visit (INDEPENDENT_AMBULATORY_CARE_PROVIDER_SITE_OTHER): Payer: Managed Care, Other (non HMO) | Admitting: Nurse Practitioner

## 2020-11-22 ENCOUNTER — Other Ambulatory Visit: Payer: Self-pay

## 2020-11-22 ENCOUNTER — Encounter: Payer: Self-pay | Admitting: Nurse Practitioner

## 2020-11-22 VITALS — BP 118/74 | Ht 60.0 in | Wt 113.0 lb

## 2020-11-22 DIAGNOSIS — Z01419 Encounter for gynecological examination (general) (routine) without abnormal findings: Secondary | ICD-10-CM

## 2020-11-22 DIAGNOSIS — Z3041 Encounter for surveillance of contraceptive pills: Secondary | ICD-10-CM

## 2020-11-22 MED ORDER — NORTREL 7/7/7 0.5/0.75/1-35 MG-MCG PO TABS: 1.0000 | ORAL_TABLET | Freq: Every day | ORAL | 3 refills | Status: DC

## 2020-11-22 NOTE — Progress Notes (Signed)
   Gina Mcfarland 10-06-1990 [id number]   History:  30 y.o. G0 presents for annual exam. Monthly cycle on OCPs. Normal pap history. History of nocturia/OAB - referred to Urogynecology last year. Symptoms have improved significantly with Pelvic floor PT. She is unsure if she has had Gardasil series.   Gynecologic History Patient's last menstrual period was 11/14/2020. Period Cycle (Days): 28 Period Duration (Days): 5 Period Pattern: Regular Menstrual Flow: Moderate, Light Dysmenorrhea: None Contraception/Family planning: OCP (estrogen/progesterone) Sexually active: No  Health Maintenance Last Pap: 11/22/2019. 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   ROS:  A ROS was performed and pertinent positives and negatives are included.  Exam:  Vitals:   11/22/20 1503  BP: 118/74  Weight: 113 lb (51.3 kg)  Height: 5' (1.524 m)    Body mass index is 22.07 kg/m.  General appearance:  Normal Thyroid:  Symmetrical, normal in size, without palpable masses or nodularity. Respiratory  Auscultation:  Clear without wheezing or rhonchi Cardiovascular  Auscultation:  Regular rate, without rubs, murmurs or gallops  Edema/varicosities:  Not grossly evident Abdominal  Soft,nontender, without masses, guarding or rebound.  Liver/spleen:  No organomegaly noted  Hernia:  None appreciated  Skin  Inspection:  Grossly normal   Breasts: Examined lying and sitting.   Right: Without masses, retractions, discharge or axillary adenopathy.   Left: Without masses, retractions, discharge or axillary adenopathy. Genitourinary   Inguinal/mons:  Normal without inguinal adenopathy  External genitalia:  Normal appearing vulva with no masses, tenderness, or  lesions  BUS/Urethra/Skene's glands:  Normal  Vagina:  Normal appearing with normal color and discharge, no lesions  Cervix:  Normal appearing without discharge or lesions  Uterus:  Normal in size, shape and contour.  Midline and mobile, nontender  Adnexa/parametria:     Rt: Normal in size, without masses or tenderness.   Lt: Normal in size, without masses or tenderness.  Anus and perineum: Normal  Patient informed chaperone available to be present for breast and pelvic exam. Patient has requested no chaperone to be present. Patient has been advised what will be completed during breast and pelvic exam.   Assessment/Plan:  30 y.o. G0 to establish care.  Well female exam with routine gynecological exam - Education provided on SBEs, importance of preventative screenings, current guidelines, high calcium diet, regular exercise, and multivitamin daily. Labs with PCP.   Encounter for surveillance of contraceptive pills - Plan: norethindrone-ethinyl estradiol (PIRMELLA 7/7/7) 0.5/0.75/1-35 MG-MCG tablet daily. Having monthly cycle. Taking as prescribed. Refill x 1 year provided.   Screening for cervical cancer - Normal pap history. Will repeat pap at 3-year interval per guidelines.   Follow up in 1 year for annual.      [name] [employer], 3:16 PM 11/22/2020

## 2021-01-21 ENCOUNTER — Other Ambulatory Visit: Payer: Self-pay

## 2021-01-21 ENCOUNTER — Ambulatory Visit: Payer: Managed Care, Other (non HMO) | Admitting: Family Medicine

## 2021-01-21 ENCOUNTER — Encounter: Payer: Self-pay | Admitting: Family Medicine

## 2021-01-21 VITALS — BP 110/60 | HR 68 | Ht 60.0 in | Wt 115.4 lb

## 2021-01-21 DIAGNOSIS — F411 Generalized anxiety disorder: Secondary | ICD-10-CM | POA: Diagnosis not present

## 2021-01-21 DIAGNOSIS — Z23 Encounter for immunization: Secondary | ICD-10-CM

## 2021-01-21 MED ORDER — ALPRAZOLAM 0.25 MG PO TABS: 0.2500 mg | ORAL_TABLET | Freq: Three times a day (TID) | ORAL | 0 refills | Status: DC | PRN

## 2021-01-21 MED ORDER — ESCITALOPRAM OXALATE 10 MG PO TABS: ORAL_TABLET | ORAL | 1 refills | Status: DC

## 2021-01-21 NOTE — Patient Instructions (Addendum)
Start escitalopram (lexapro) at 1/2 tablet once daily.   After 1 week, if tolerating it, then increase to the full tablet. You can go slower if you have trouble tolerating it.  We are also prescribing alprazolam as a "rescue" medication to treat severe anxiety.  The lexapro potentially can increase the anxiety as it is first started, so having this on hand should help ease your mind.  Try the 4 count breathing technique when anxiety overcomes you (ie when you wake up).  We will put in a referral to [facility] to help match you with a therapist that takes your insurance.  If that isn't working out, then continue to work on reaching out to your EAP program through work.  Consider that the morning nausea and chest symptoms could potentially be reflux.  So, if this isn't improving, consider taking a Prilosec OTC before dinner or bedtime.

## 2021-01-21 NOTE — Progress Notes (Signed)
Chief Complaint  Patient presents with   Anxiety    Anxiety that happens in the morning, esp before she goes into work. This is when she has anxiety attacks, feelings of being overwhelmed and sad as well. Pounding chest, the need to get sick and the need to cry. Called today because she really needed to see someone.     Struggling with anxiety on/off for years, since graduate school. Reports it got worse since COVID. 2 years ago, when she lived in [place] ,it was worse. She thought moving and a different job would help, but moods aren't improving. Some times are better than others, but then comes back full force. Has never learned how to manage it. Has never been treated or gotten counseling.  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 She has never taken medications for anxiety..  She reports that she wakes up with pounding her chest and vomits (dry heaves, acid in her stomach). She feels a need to be sick, feels like getting sick resolves the sensation (pounding in her chest). It can happen on Sundays when thinking about work the next day, anticipating something. Sometimes has uncontrollable crying.   Supportive family, friends, has a dog. Denies depression.  Feels hopeless only in that she can't control the anxiety. Hopeless about finding help.   PMH, PSH, SH reviewed Marine scientist (mainly HS, some middle school). Walks dog daily.  Outpatient Encounter Medications as of 01/21/2021  Medication Sig Note   Multiple Vitamin (MULTIVITAMIN) capsule Take 1 capsule by mouth daily.    norethindrone-ethinyl estradiol (NORTREL 7/7/7) 0.5/0.75/1-35 MG-MCG tablet Take 1 tablet by mouth daily.    oxybutynin (DITROPAN-XL) 10 MG 24 hr tablet TAKE 1 TABLET BY MOUTH EVERYDAY AT BEDTIME (Patient not taking: Reported on 11/22/2020) 01/21/2021: Has  not been needed   [DISCONTINUED] propranolol (INDERAL) 10 MG tablet TAKE 1 TABLET BY MOUTH EVERY DAY (Patient not taking: No sig reported)    No facility-administered encounter medications on file as of 01/21/2021.   No Known Allergies  ROS: No f/c/HA, URI symptoms, shortness of breath.  Normal appetite, no weight changes. No constipation, diarrhea. Denies heartburn.  +anxiety, palpitations (chest pounding in morning) and vomiting in mornings, per HPI   PHYSICAL EXAM:  BP 110/60    Pulse 68    Ht 5' (1.524 m)    Wt 115 lb 6.4 oz (52.3 kg)    LMP 01/19/2021 Comment: light spotting   BMI 22.54 kg/m   Pleasant, well-appearing female, in no distress She is alert, oriented. Normal hygiene, grooming, eye contact and speech. She is anxious, with full range of affect.  At times, on the verge of being tearful. Normal gait.  Depression screen [facility] 2/9 01/21/2021 10/19/2019  Decreased Interest 2 1  Down, Depressed, Hopeless 2 2  PHQ - 2 Score 4 3  Altered sleeping 0 3  Tired, decreased energy 3 3  Change in appetite 0 3  Feeling bad or failure about yourself  1 3  Trouble concentrating 1 0  Moving slowly or fidgety/restless 0 0  Suicidal thoughts 0 0  PHQ-9 Score 9 15  Difficult doing work/chores Very difficult Not difficult at all   GAD 7 : Generalized Anxiety Score 01/21/2021 10/19/2019  Nervous, Anxious, on Edge 3 3  Control/stop worrying 3 3  Worry too much - different things 3  3  Trouble relaxing 3 0  Restless 3 0  Easily annoyed or irritable 1 0  Afraid - awful might happen 2 1  Total GAD 7 Score 18 10  Anxiety Difficulty Somewhat difficult Not difficult at all    ASSESSMENT/PLAN:  Generalized anxiety disorder - Counseling and start meds--SSRI risks/SE reviewed. xanax to have on hand for worsening anxiety related to SSRI. 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 Risks/SE of both meds reviewed in detail  Strongly encouraged counseling--discussed checking through her EAP program, but prefers to try being matched up through [facility]--prefers in person, 2pm or later  Taught 4 count breathing technique.  Discussed grounding techniques, regular exercise, mindfulness. Consider GERD if morning nausea persists, and can try prilosec OTC.  F/u 4 wks  I spent 51 minutes dedicated to the care of this patient, including pre-visit review of records, face to face time, post-visit ordering of testing and documentation.

## 2021-02-13 ENCOUNTER — Other Ambulatory Visit: Payer: Self-pay | Admitting: Family Medicine

## 2021-02-13 DIAGNOSIS — F411 Generalized anxiety disorder: Secondary | ICD-10-CM

## 2021-02-18 ENCOUNTER — Encounter: Payer: Managed Care, Other (non HMO) | Admitting: Family Medicine

## 2021-02-27 DIAGNOSIS — F411 Generalized anxiety disorder: Secondary | ICD-10-CM | POA: Insufficient documentation

## 2021-02-27 NOTE — Progress Notes (Signed)
Chief Complaint  Patient presents with   Follow-up    Follow up. Did she therapist and is seeing again next week. (Her visit was one of the one's I printed off for you). No new concerns.     Patient presents for 6 week f/u on anxiety. PHQ-9 score was 9, and GAD-7 score was 18 at her visit in January. She was started on lexapro, prescribed xanax to have on hand, if needed, and referred to [facility] to get hooked up with a therapist.  She had 1 session so far, really liked her and has another visit scheduled.  Last visit she reported waking up with pounding her chest and vomits (dry heaves, acid in her stomach). She feels a need to be sick, feels like getting sick resolves the sensation (pounding in her chest). She reported it can happen on Sundays when thinking about work the next day, anticipating something. Sometimes has uncontrollable crying. Denied feeling depressed, and felt hopeless in that she couldn't control her anxiety, about finding help.    Today she reports-- She needed to start with 1/4 tablet (felt dizzy with first 1/2 tablet). It made her a little sleepy, took it at night, noted a slight off-balance in the morning.  She decreased to 1/4 tab and worked her way back up to 1/2, and has been on the full tablet now for about 4 weeks.  Currently denies any side effects.  She can't quite tell how well it is working. She hasn't had as much uncontrollable crying.  Has still had pounding in her chest, and felt she needs to get sick.  Happened every day last week, not today.   She has been waking up an hour before her alarm goes off and can't get back to sleep. Has had a lot going on at work.  Took xanax once when panicky getting ready for work.  Felt SOB, thoughts were spiraling. It helped, not sedating.   PMH, PSH, SH reviewed  Outpatient Encounter Medications as of 02/28/2021  Medication Sig Note   Multiple Vitamin (MULTIVITAMIN) capsule Take 1 capsule by mouth daily.     norethindrone-ethinyl estradiol (NORTREL 7/7/7) 0.5/0.75/1-35 MG-MCG tablet Take 1 tablet by mouth daily.    [DISCONTINUED] escitalopram (LEXAPRO) 10 MG tablet Start at 1/2 tablet by mouth. Increase to the full tablet at a week (or when tolerated) (Patient taking differently: Take 10 mg by mouth daily.)    ALPRAZolam (XANAX) 0.25 MG tablet Take 1 tablet (0.25 mg total) by mouth 3 (three) times daily as needed for anxiety or sleep. (Patient not taking: Reported on 02/28/2021) 02/28/2021: As needed;only used once   escitalopram (LEXAPRO) 10 MG tablet Take 1.5 tablets (15 mg total) by mouth at bedtime.    oxybutynin (DITROPAN-XL) 10 MG 24 hr tablet TAKE 1 TABLET BY MOUTH EVERYDAY AT BEDTIME (Patient not taking: Reported on 11/22/2020) 01/21/2021: Has not been needed   No facility-administered encounter medications on file as of 02/28/2021.   Taking 10mg  lexapro prior to today's visit  No Known Allergies  ROS: no fever, URI symptoms, headaches, n/v/d.  +anxiety per HPI.   PHYSICAL EXAM:  BP 110/60    Pulse 80    Ht 5' (1.524 m)    Wt 113 lb 3.2 oz (51.3 kg)    LMP 02/17/2021 (Exact Date)    BMI 22.11 kg/m   Wt Readings from Last 3 Encounters:  02/28/21 113 lb 3.2 oz (51.3 kg)  01/21/21 115 lb 6.4 oz (52.3 kg)  11/22/20 113 lb (51.3 kg)   Well-appearing, pleasant female, in no distress. She is alert and oriented, normal eye contact, and speech. Slightly anxious but full range of affect   Depression screen [facility] 2/9 02/28/2021 01/21/2021 10/19/2019  Decreased Interest 1 2 1   Down, Depressed, Hopeless 2 2 2   PHQ - 2 Score 3 4 3   Altered sleeping 3 0 3  Tired, decreased energy 1 3 3   Change in appetite 0 0 3  Feeling bad or failure about yourself  1 1 3   Trouble concentrating 1 1 0  Moving slowly or fidgety/restless 0 0 0  Suicidal thoughts 0 0 0  PHQ-9 Score 9 9 15   Difficult doing work/chores Somewhat difficult Very difficult Not difficult at all   GAD 7 : Generalized Anxiety Score  02/28/2021 01/21/2021 10/19/2019  Nervous, Anxious, on Edge 2 3 3   Control/stop worrying 2 3 3   Worry too much - different things 2 3 3   Trouble relaxing 1 3 0  Restless 1 3 0  Easily annoyed or irritable 0 1 0  Afraid - awful might happen 1 2 1   Total GAD 7 Score 9 18 10   Anxiety Difficulty Somewhat difficult Somewhat difficult Not difficult at all     ASSESSMENT/PLAN:  Generalized anxiety disorder - Continue counseling. Stay at 10mg  lexapro for another week or two. May further increase to 1.5 tablets if not improving. Discussed Buspar as poss in future - Plan: escitalopram (LEXAPRO) 10 MG tablet   Asked about any tetanus boosters. She reports getting Tdap from [place], when traveled to [place] in 03/2016  Stay on the 10mg  tablet at bedtime for now. It likely will continue to work a little better over the next couple of weeks. Let's see how this, along with continued therapy, helps. If your therapist and you agree that more medication would be helpful, you can increase the lexapro to 1.5 tablets at bedtime (total dose of 15mg ).  Stay there for 4 weeks. If you can't tolerate this higher dose, go back to 10mg  and call us for a buspar prescription. If you do tolerate the 15mg , follow-up here in 4-6 weeks for [place] to determine if further med changes are needed.  We are sending in a refill for the higher dose, in case you need it, since I will be out of town.  If you truly do not need to go up on the dose, just continue 10 mg daily (and the prescription will last longer).  I spent 40 minutes dedicated to the care of this patient, including pre-visit review of records, face to face time, post-visit ordering of testing and documentation.

## 2021-02-28 ENCOUNTER — Other Ambulatory Visit: Payer: Self-pay

## 2021-02-28 ENCOUNTER — Encounter: Payer: Self-pay | Admitting: Family Medicine

## 2021-02-28 ENCOUNTER — Ambulatory Visit: Payer: Managed Care, Other (non HMO) | Admitting: Family Medicine

## 2021-02-28 VITALS — BP 110/60 | HR 80 | Ht 60.0 in | Wt 113.2 lb

## 2021-02-28 DIAGNOSIS — F411 Generalized anxiety disorder: Secondary | ICD-10-CM

## 2021-02-28 MED ORDER — ESCITALOPRAM OXALATE 10 MG PO TABS: 15.0000 mg | ORAL_TABLET | Freq: Every day | ORAL | 1 refills | Status: DC

## 2021-02-28 NOTE — Patient Instructions (Signed)
Stay on the 10mg  tablet at bedtime for now. It likely will continue to work a little better over the next couple of weeks. Let's see how this, along with continued therapy, helps. If your therapist and you agree that more medication would be helpful, you can increase the lexapro to 1.5 tablets at bedtime (total dose of 15mg ).  Stay there for 4 weeks. If you can't tolerate this higher dose, go back to 10mg  and call us for a buspar prescription. If you do tolerate the 15mg , follow-up here in 4-6 weeks for [place] to determine if further med changes are needed.  We are sending in a refill for the higher dose, in case you need it, since I will be out of town.  If you truly do not need to go up on the dose, just continue 10 mg daily (and the prescription will last longer).

## 2021-03-20 ENCOUNTER — Other Ambulatory Visit: Payer: Self-pay | Admitting: Family Medicine

## 2021-03-20 DIAGNOSIS — F411 Generalized anxiety disorder: Secondary | ICD-10-CM

## 2021-04-01 ENCOUNTER — Other Ambulatory Visit: Payer: Self-pay | Admitting: Family Medicine

## 2021-04-01 DIAGNOSIS — F411 Generalized anxiety disorder: Secondary | ICD-10-CM

## 2021-05-11 ENCOUNTER — Other Ambulatory Visit: Payer: Self-pay | Admitting: Family Medicine

## 2021-05-11 DIAGNOSIS — F411 Generalized anxiety disorder: Secondary | ICD-10-CM

## 2021-05-13 NOTE — Telephone Encounter (Signed)
Left message to see if patient needed this refilled. °

## 2021-05-14 NOTE — Telephone Encounter (Signed)
Lvm for pt to call back if she ned her lexapro. Kh ?

## 2021-06-03 ENCOUNTER — Other Ambulatory Visit: Payer: Self-pay | Admitting: Family Medicine

## 2021-06-03 DIAGNOSIS — F411 Generalized anxiety disorder: Secondary | ICD-10-CM

## 2021-06-03 NOTE — Telephone Encounter (Signed)
Left message for patient to see if she needs this refill and if so, she would need to schedule 3 mo follow up.

## 2021-06-04 ENCOUNTER — Encounter: Payer: Self-pay | Admitting: *Deleted

## 2021-06-08 NOTE — Progress Notes (Unsigned)
Start time: End time:  Virtual Visit via Video Note  I connected with Gina Mcfarland on 06/08/21 by a video enabled telemedicine application and verified that I am speaking with the correct person using two identifiers.  Location: Patient: *** Provider: office   I discussed the limitations of evaluation and management by telemedicine and the availability of in person appointments. The patient expressed understanding and agreed to proceed.  History of Present Illness:  No chief complaint on file.  Patient presents for f/u on anxiety.  Lexapro was started in 01/2021.  At her last f/u she had been on the full 10mg  dose for just about 4 weeks (needed to start very slowlly due to dizziness, feeling a little off balanced).  Crying had improved, was still getting pounding in her chest and felt like she needed to get sick, was waking up an hour before her alarm and wasn't able to get back to sleep. She had only had 1 therapy session, and wasn't sure how well the  medication was working. She also was prescribed alprazolam to have on hand for prn use.  Had used it once prior to last visit with good results. Her GAD-7 had improved from 18 to 9; PHQ-9 score remained at 9 (unchanged). Took xanax once when panicky getting ready for work.  Felt SOB, thoughts were spiraling. It helped, not sedating.   At the f/u visit in February we discussed staying on the 10mg  for another week or two, and if doesn't see any continued improvement, to increase to 15mg  daily. We had also discussed Buspar as an option, if not improving, or if unable to tolerate higher doses.  She has continued with counseling. Today she reports.    Observations/Objective:  There were no vitals taken for this visit.   Assessment and Plan:  GAD-7, PHQ-9  Follow Up Instructions:    I discussed the assessment and treatment plan with the patient. The patient was provided an opportunity to ask questions and all were answered. The  patient agreed with the plan and demonstrated an understanding of the instructions.   The patient was advised to call back or seek an in-person evaluation if the symptoms worsen or if the condition fails to improve as anticipated.  I spent *** minutes dedicated to the care of this patient, including pre-visit review of records, face to face time, post-visit ordering of testing and documentation.    March, MD

## 2021-06-12 ENCOUNTER — Telehealth: Payer: Managed Care, Other (non HMO) | Admitting: Family Medicine

## 2021-06-12 ENCOUNTER — Encounter: Payer: Self-pay | Admitting: Family Medicine

## 2021-06-12 VITALS — BP 116/80 | HR 77 | Ht 60.0 in | Wt 122.6 lb

## 2021-06-12 DIAGNOSIS — F411 Generalized anxiety disorder: Secondary | ICD-10-CM

## 2021-06-12 MED ORDER — ESCITALOPRAM OXALATE 10 MG PO TABS: 10.0000 mg | ORAL_TABLET | Freq: Every day | ORAL | 1 refills | Status: DC

## 2021-09-03 ENCOUNTER — Telehealth: Payer: Self-pay | Admitting: Family Medicine

## 2021-09-03 NOTE — Telephone Encounter (Signed)
Pt called and says she has about two weeks left of her lexapro and she wanted to be proactive and ask for a refill before it runs out.

## 2021-09-03 NOTE — Telephone Encounter (Signed)
Advise pt that there should be a refill at her pharmacy. It was sent for 90d WITH a refill at her May visit. She should call her pharmacy

## 2021-09-18 ENCOUNTER — Encounter: Payer: Self-pay | Admitting: Internal Medicine

## 2021-09-22 ENCOUNTER — Other Ambulatory Visit: Payer: Self-pay | Admitting: Family Medicine

## 2021-09-22 DIAGNOSIS — F411 Generalized anxiety disorder: Secondary | ICD-10-CM

## 2021-09-23 NOTE — Telephone Encounter (Signed)
Pt. Called back stating that she did need her lexapro refilled to [facility] [address]. She is no longer in [place]. She was only there for part of the summer and is now back in [place].

## 2021-09-23 NOTE — Telephone Encounter (Signed)
Looks like this was sent to [place] in [place] 06/12/21 for #90 w/1 refill. Left message asking patient if she truly needs this refill.

## 2021-09-23 NOTE — Telephone Encounter (Signed)
She should be able to transfer the refill from [facility] that should be on file. If that's too much of a problem, ok to RF x 90, NO add'l refill (has appt in November)

## 2021-11-04 ENCOUNTER — Encounter: Payer: Self-pay | Admitting: *Deleted

## 2021-11-13 ENCOUNTER — Encounter: Payer: Managed Care, Other (non HMO) | Admitting: Family Medicine

## 2021-11-19 NOTE — Progress Notes (Unsigned)
No chief complaint on file.  Patient presents for f/u on anxiety.  Lexapro was started in 01/2021. Last seen in May, and was doing well on the 10mg  dose.  She hadn't been having any anxiety attacks.  PHQ-9 score was 2, and GAD-7 score was 3 (had been 18 prior to starting medication).  She has continued with counseling, every 2 weeks.     PMH, PSH, SH reviewed    PHYSICAL EXAM:  There were no vitals taken for this visit.  Wt Readings from Last 3 Encounters:  06/12/21 122 lb 9.6 oz (55.6 kg)  02/28/21 113 lb 3.2 oz (51.3 kg)  01/21/21 115 lb 6.4 oz (52.3 kg)      ASSESSMENT/PLAN:   GAD-7 Phq-9 if any depression  Flu and covid shots

## 2021-11-20 ENCOUNTER — Encounter: Payer: Self-pay | Admitting: Family Medicine

## 2021-11-20 ENCOUNTER — Ambulatory Visit: Payer: Managed Care, Other (non HMO) | Admitting: Family Medicine

## 2021-11-20 VITALS — BP 100/60 | HR 68 | Ht 60.0 in | Wt 132.6 lb

## 2021-11-20 DIAGNOSIS — Z23 Encounter for immunization: Secondary | ICD-10-CM

## 2021-11-20 DIAGNOSIS — F411 Generalized anxiety disorder: Secondary | ICD-10-CM | POA: Diagnosis not present

## 2021-11-20 DIAGNOSIS — R635 Abnormal weight gain: Secondary | ICD-10-CM | POA: Diagnosis not present

## 2021-11-20 MED ORDER — ESCITALOPRAM OXALATE 10 MG PO TABS: 10.0000 mg | ORAL_TABLET | Freq: Every day | ORAL | 3 refills | Status: DC

## 2021-11-20 NOTE — Patient Instructions (Signed)
Let us know if anxiety worsens, or if you continue to gain weight and want to try a different medication.

## 2021-11-26 ENCOUNTER — Ambulatory Visit (INDEPENDENT_AMBULATORY_CARE_PROVIDER_SITE_OTHER): Payer: Managed Care, Other (non HMO) | Admitting: Nurse Practitioner

## 2021-11-26 ENCOUNTER — Encounter: Payer: Self-pay | Admitting: Nurse Practitioner

## 2021-11-26 VITALS — BP 122/62 | HR 100 | Resp 18 | Ht 59.25 in | Wt 133.8 lb

## 2021-11-26 DIAGNOSIS — Z01419 Encounter for gynecological examination (general) (routine) without abnormal findings: Secondary | ICD-10-CM | POA: Diagnosis not present

## 2021-11-26 DIAGNOSIS — Z3041 Encounter for surveillance of contraceptive pills: Secondary | ICD-10-CM | POA: Diagnosis not present

## 2021-11-26 IMAGING — CT CT ABD-PELV W/ CM
2 of 4 series · 16 of 46 positions shown, 18 images · IV contrast (Omni 300)
Comparison: None

CLINICAL DATA: RIGHT lower quadrant abdominal pain question
appendicitis

EXAM:
CT ABDOMEN AND PELVIS WITH CONTRAST
TECHNIQUE: Multidetector CT imaging of the abdomen and pelvis was performed
using the standard protocol following bolus administration of
intravenous contrast. Sagittal and coronal MPR images reconstructed
from axial data set.
CONTRAST:  100mL OMNIPAQUE IOHEXOL 300 MG/ML SOLN IV. No oral
contrast administered.

[Series 3: a/p w/ 5mm · axial · 0.65mm/px · z∈[+773,+1163]mm · 13 of 86 slices shown, 15 images]
[im 4/86  soft-tissue]
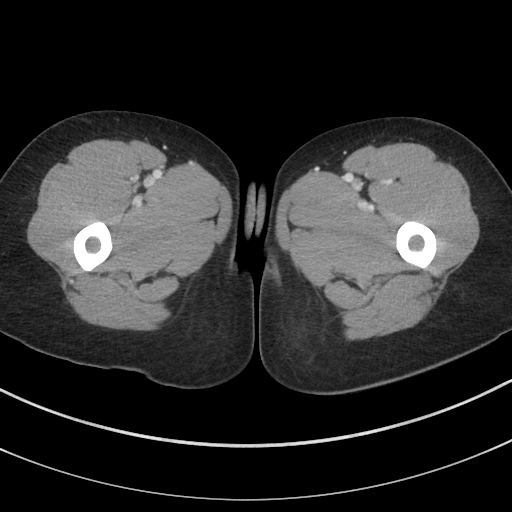
[im 4/86  bone]
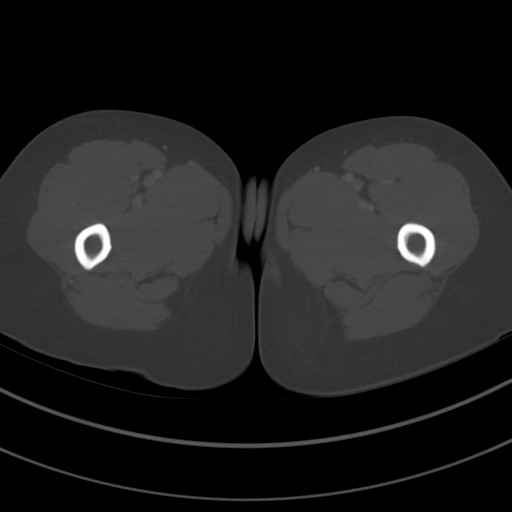
[im 12/86  soft-tissue]
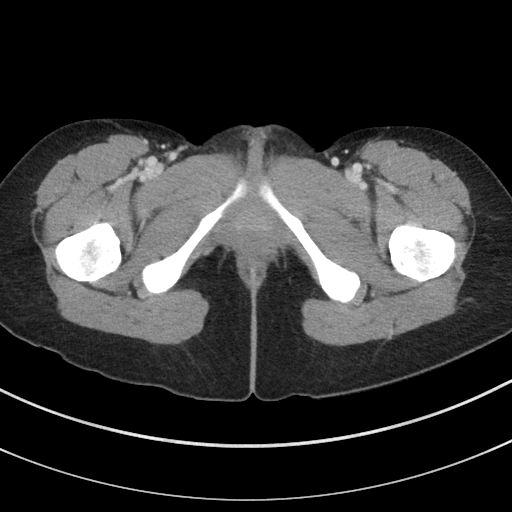
[im 19/86  soft-tissue]
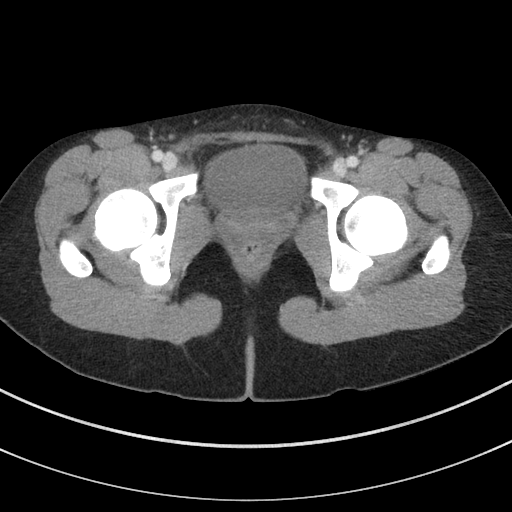
[im 23/86  soft-tissue]
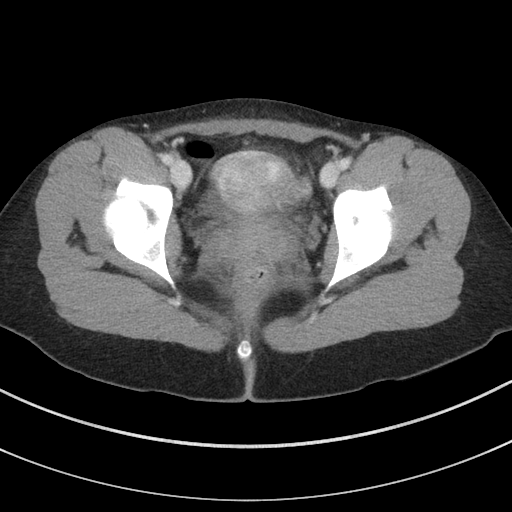
[im 30/86  soft-tissue]
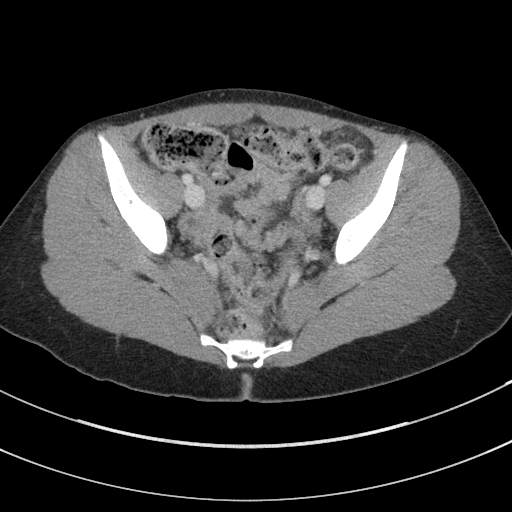
[im 37/86  soft-tissue]
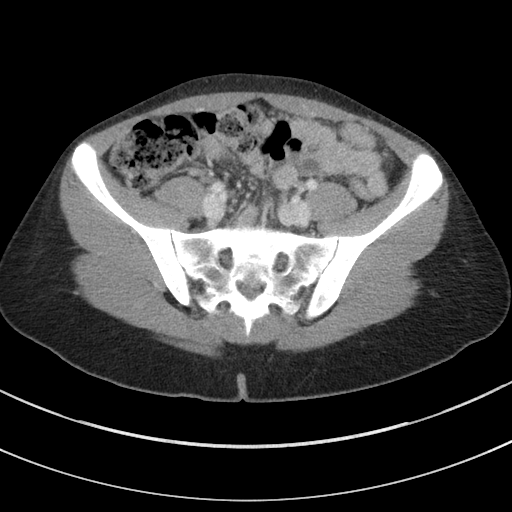
[im 45/86  soft-tissue]
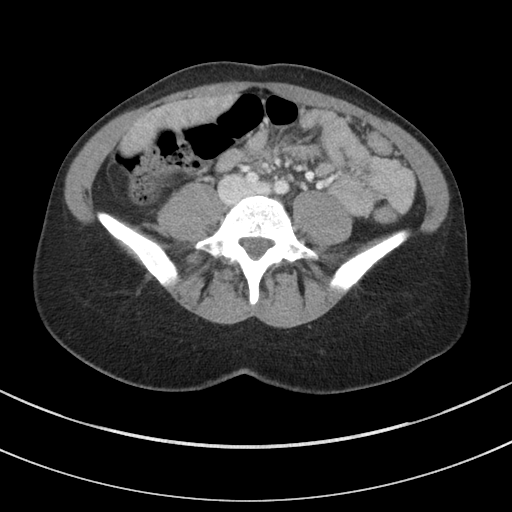
[im 49/86  soft-tissue]
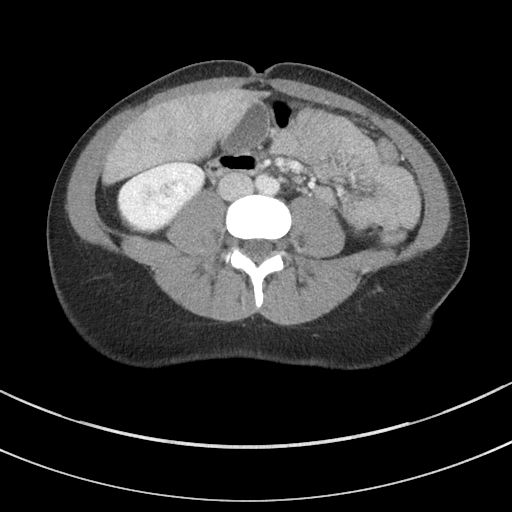
[im 56/86  soft-tissue]
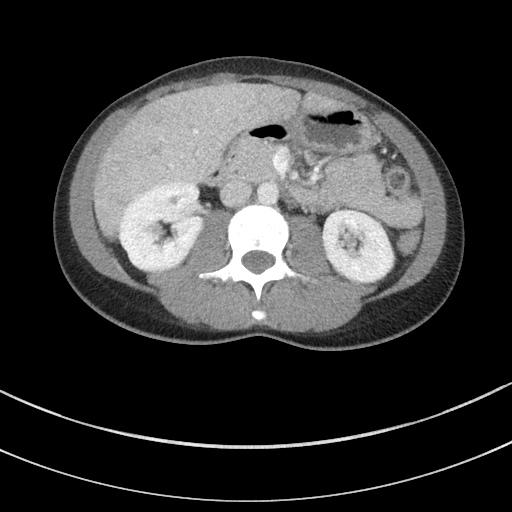
[im 56/86  bone]
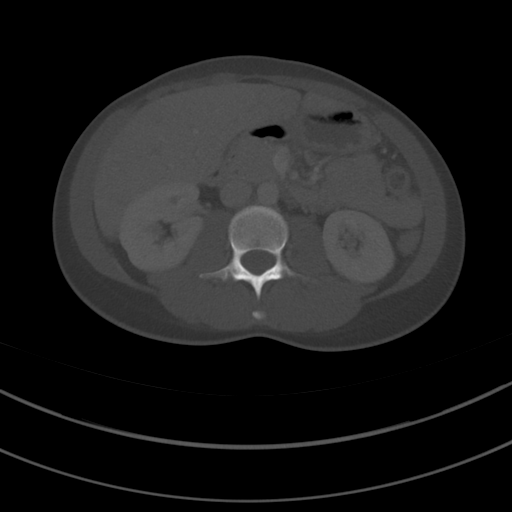
[im 63/86  soft-tissue]
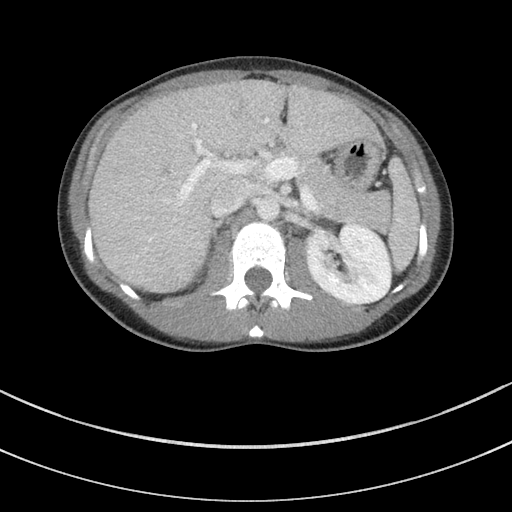
[im 67/86  soft-tissue]
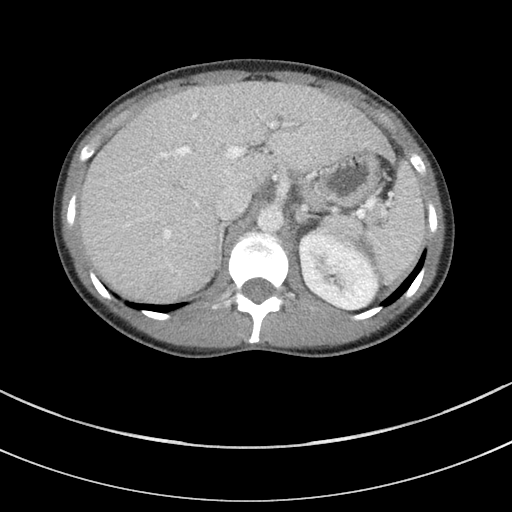
[im 74/86  soft-tissue]
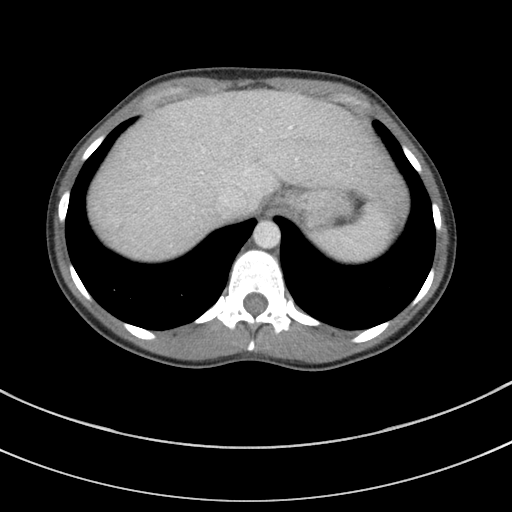
[im 82/86  soft-tissue]
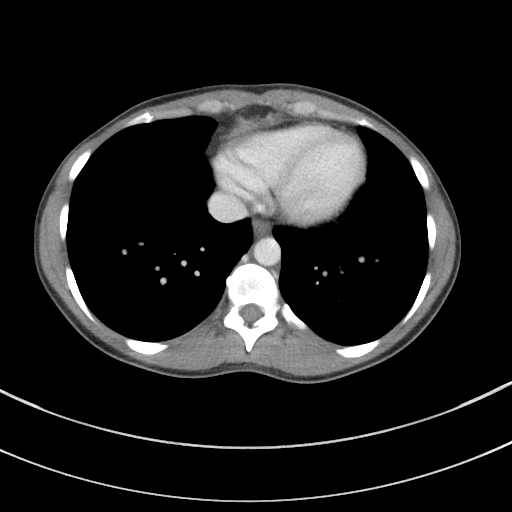

[Series 6: a/p w/ cor · coronal · 0.64mm/px · 3 of 119 slices shown]
[im 40/119  soft-tissue]
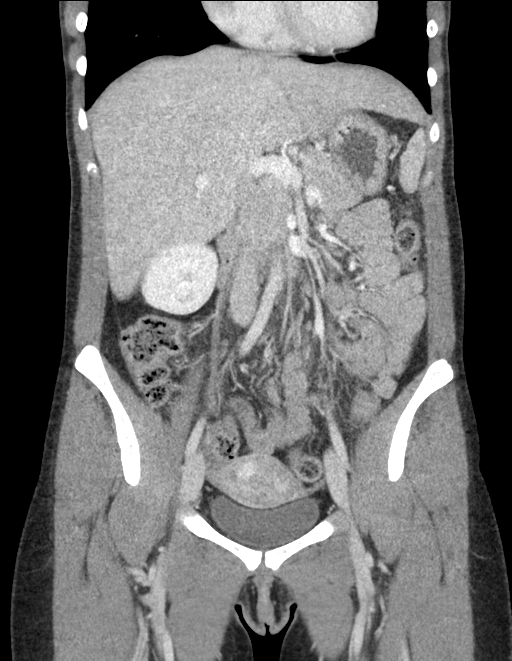
[im 53/119  soft-tissue]
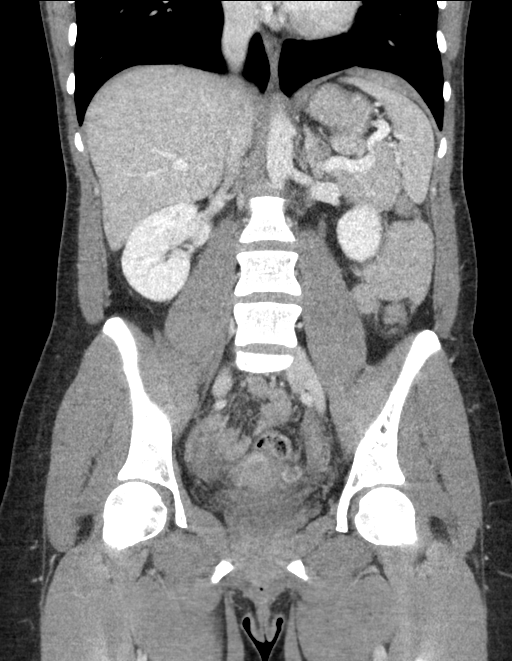
[im 66/119  soft-tissue]
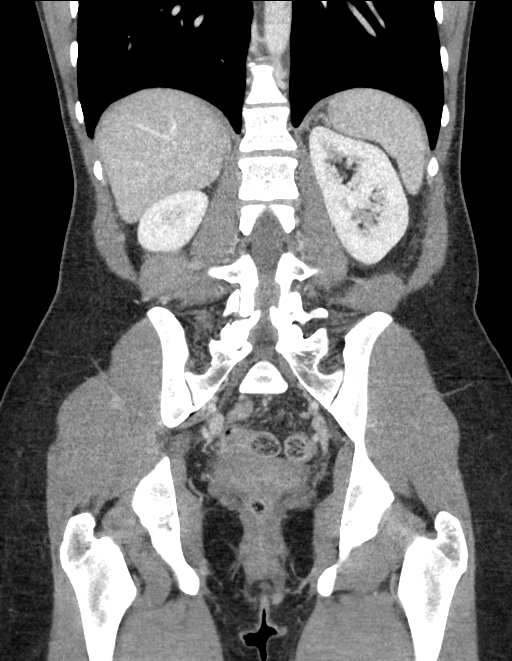

[16 of 46 positions shown; findings below may reference images not displayed]

FINDINGS: Lower chest: Lung bases clear

Hepatobiliary: Gallbladder and liver normal appearance

Pancreas: Normal appearance

Spleen: Normal appearance

Adrenals/Urinary Tract: Adrenal glands, kidneys, ureters, and
bladder normal appearance

Stomach/Bowel: Normal appendix. Stomach and bowel loops normal
appearance.

Vascular/Lymphatic: Vascular structures patent. No definite
adenopathy.

Reproductive: Patchy enhancement of uterus, may represent
heterogeneous myometrial enhancement but cannot exclude small
leiomyomata. Unremarkable adnexa.

Other: Small amount of nonspecific free pelvic fluid, potentially
physiologic. No free air. No hernia. No definite inflammatory
process seen.

Musculoskeletal: Unremarkable
IMPRESSION: Normal appendix.

No acute intra-abdominal or intrapelvic abnormalities identified.

## 2021-11-26 IMAGING — US US PELVIS COMPLETE TRANSABD/TRANSVAG W DUPLEX
1 series · 13 of 25 positions shown · non-contrast
Comparison: CT abdomen pelvis 03/16/2020

CLINICAL DATA: Pelvic pain X 2 days.

EXAM:
TRANSABDOMINAL AND TRANSVAGINAL ULTRASOUND OF PELVIS
DOPPLER ULTRASOUND OF OVARIES
TECHNIQUE: Both transabdominal and transvaginal ultrasound examinations of the
pelvis were performed. Transabdominal technique was performed for
global imaging of the pelvis including uterus, ovaries, adnexal
regions, and pelvic cul-de-sac.
It was necessary to proceed with endovaginal exam following the
transabdominal exam to visualize the uterus, endometrium, bilateral
ovaries, bilateral adnexa. Color and duplex Doppler ultrasound was
utilized to evaluate blood flow to the ovaries.

[Series 1: us pelvic complete w transvaginal and torsion righ · 115 acquisitions, 13 frames shown]
[im 1/115]
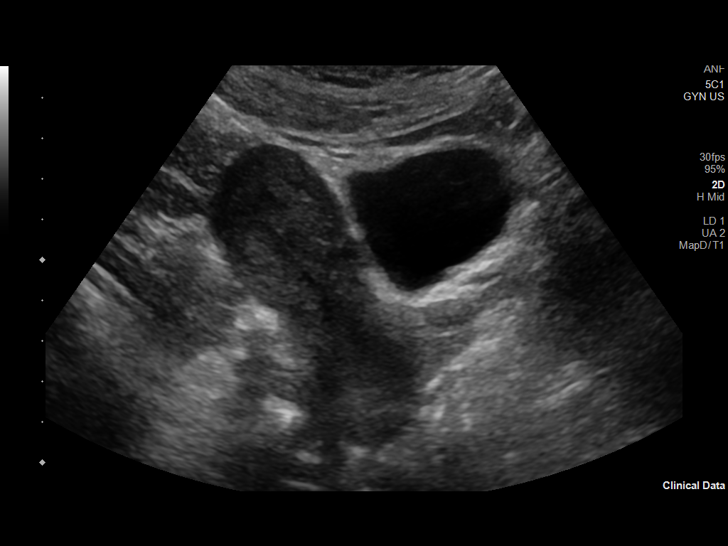
[im 10/115]
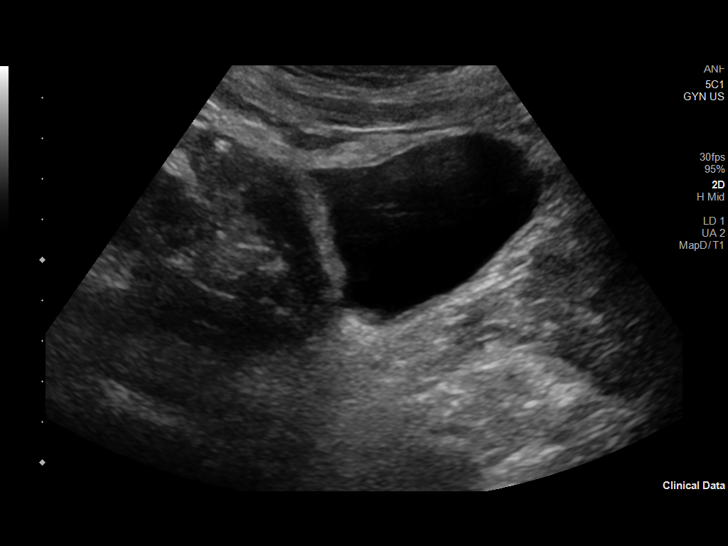
[im 20/115]
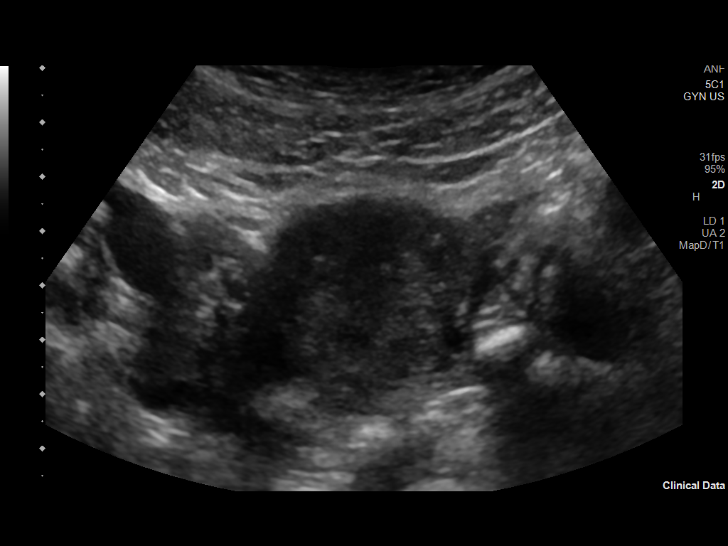
[im 29/115]
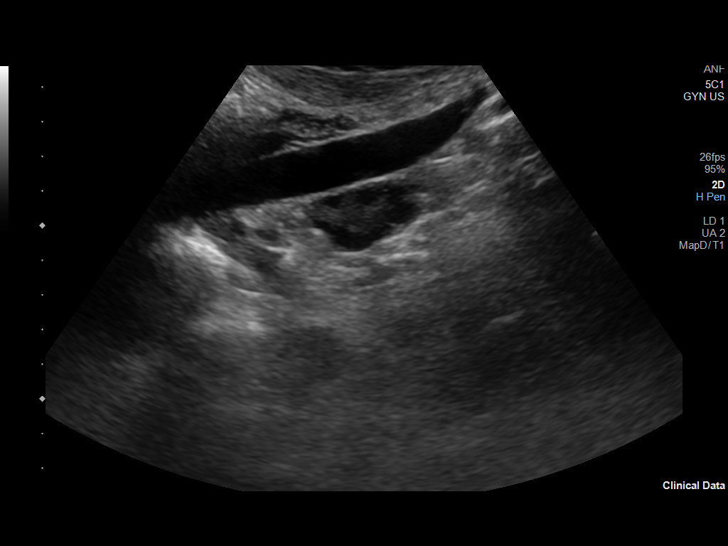
[im 39/115]
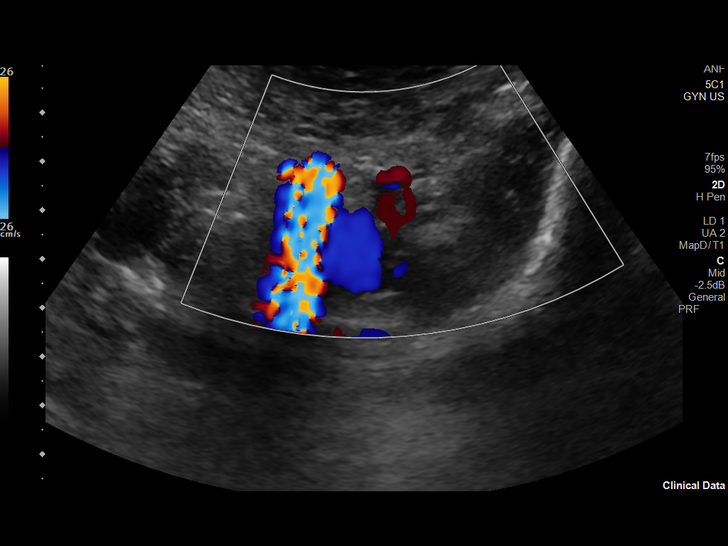
[im 48/115]
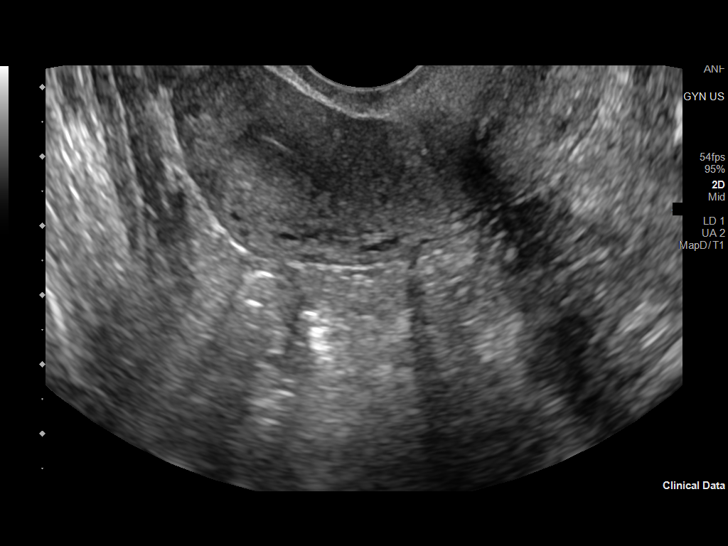
[im 58/115]
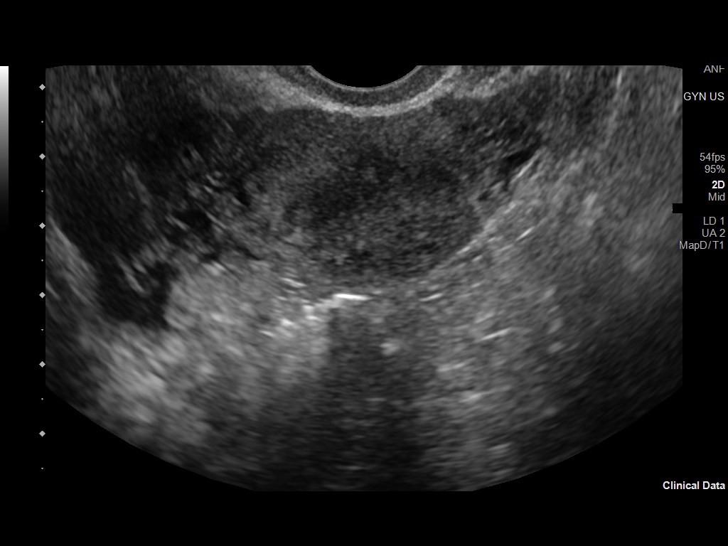
[im 67/115]
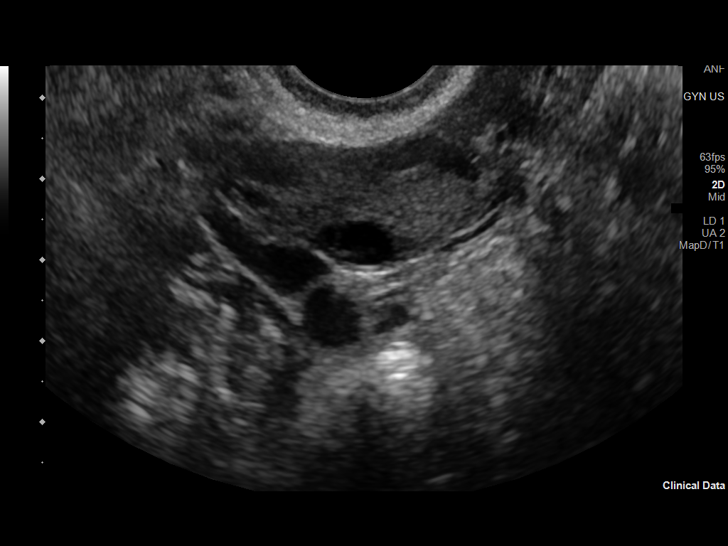
[im 77/115]
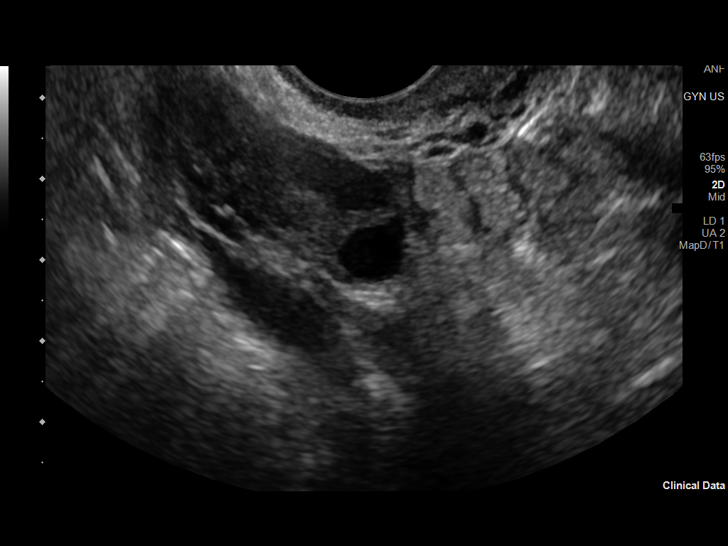
[im 86/115]
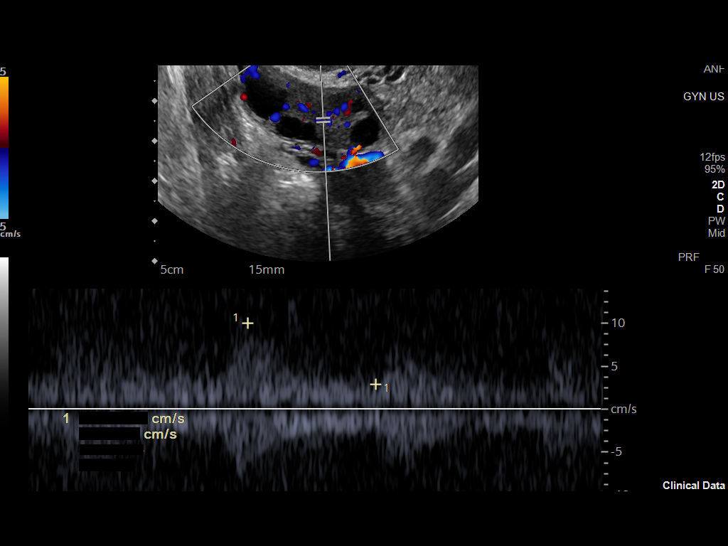
[im 96/115]
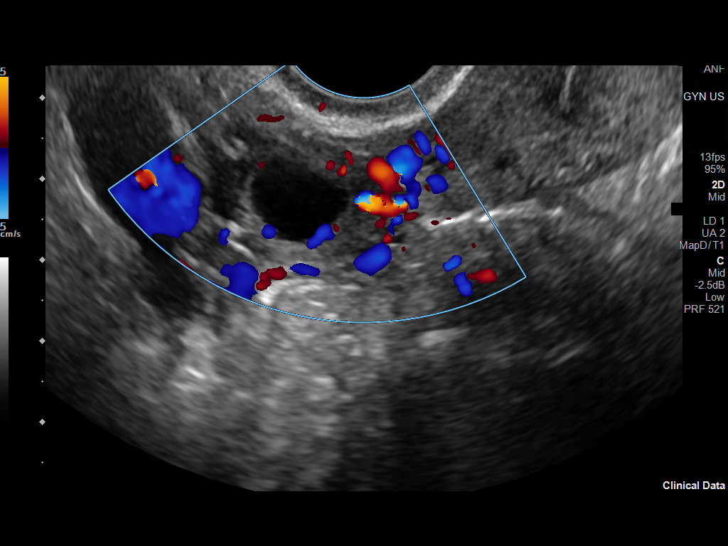
[im 105/115]
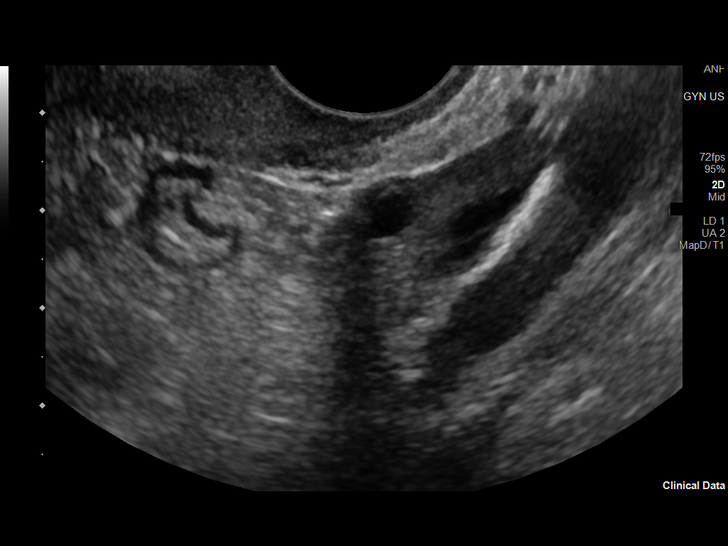
[im 115/115]
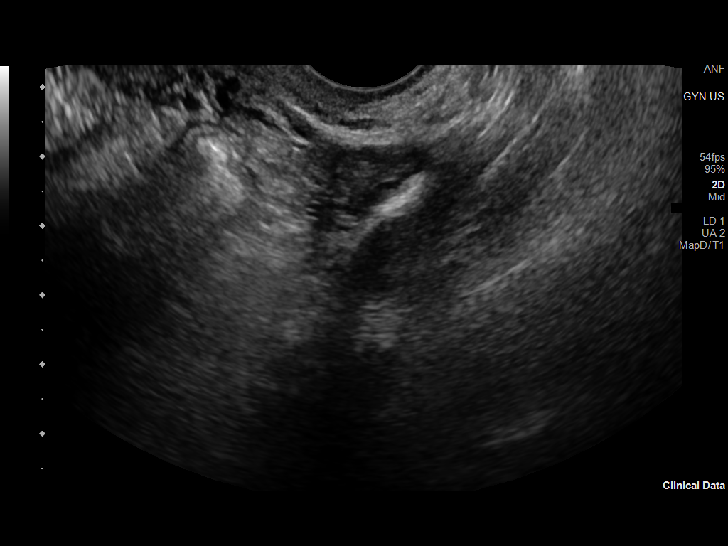

[13 of 25 positions shown; findings below may reference images not displayed]

FINDINGS: Uterus

Measurements: 6.4 x 2.4 x 4.5 cm = volume: 36 mL. No fibroids or
other mass visualized.

Endometrium

Thickness: 1 mm.  No focal abnormality visualized.

Right ovary

Measurements: 3.8 x 1.6 x 3.1 cm = volume: 9.6 mL. A cystic lesion
measuring 1.3 x 1.1 x 1.1 cm is simple in appearance and likely
represents abdominal follicle. Normal appearance/no adnexal mass.

Left ovary

Measurements: 3.4 x 1.5 x 2.3 cm = volume: 5.9 mL. Normal
appearance/no adnexal mass.

Pulsed Doppler evaluation of both ovaries demonstrates normal
low-resistance arterial and venous waveforms.

Other findings

No abnormal free fluid.
IMPRESSION: Unremarkable pelvic ultrasound.

## 2021-11-26 MED ORDER — NORTREL 7/7/7 0.5/0.75/1-35 MG-MCG PO TABS: 1.0000 | ORAL_TABLET | Freq: Every day | ORAL | 3 refills | Status: DC

## 2021-11-26 NOTE — Progress Notes (Signed)
   Gina Mcfarland 30-Jan-1990 [id number]   History:  31 y.o. G0 presents for annual exam. Monthly cycle on OCPs. Normal pap history. History of nocturia/OAB, no longer on Oxybutynin, did pelvic floor PT. She is unsure if she has had Gardasil series.   Gynecologic History Patient's last menstrual period was 10/29/2021 (approximate). Period Duration (Days): 4 Period Pattern: Regular Menstrual Flow: Moderate Menstrual Control: Maxi pad Menstrual Control Change Freq (Hours): 12 Dysmenorrhea: None Contraception/Family planning: OCP (estrogen/progesterone) Sexually active: No  Health Maintenance Last Pap: 11/22/2019. 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   ROS:  A ROS was performed and pertinent positives and negatives are included.  Exam:  Vitals:   11/26/21 1503  BP: 122/62  Pulse: 100  Resp: 18  SpO2: 99%  Weight: 133 lb 12.8 oz (60.7 kg)  Height: 4' 11.25" (1.505 m)     Body mass index is 26.79 kg/m.  General appearance:  Normal Thyroid:  Symmetrical, normal in size, without palpable masses or nodularity. Respiratory  Auscultation:  Clear without wheezing or rhonchi Cardiovascular  Auscultation:  Regular rate, without rubs, murmurs or gallops  Edema/varicosities:  Not grossly evident Abdominal  Soft,nontender, without masses, guarding or rebound.  Liver/spleen:  No organomegaly noted  Hernia:  None appreciated  Skin  Inspection:  Grossly normal   Breasts: Examined lying and sitting.   Right: Without masses, retractions, discharge or axillary adenopathy.   Left: Without masses, retractions, discharge or axillary adenopathy. Genitourinary   Inguinal/mons:  Normal without inguinal adenopathy  External genitalia:  Normal  appearing vulva with no masses, tenderness, or lesions  BUS/Urethra/Skene's glands:  Normal  Vagina:  Normal appearing with normal color and discharge, no lesions  Cervix:  Normal appearing without discharge or lesions  Uterus:  Normal in size, shape and contour.  Midline and mobile, nontender  Adnexa/parametria:     Rt: Normal in size, without masses or tenderness.   Lt: Normal in size, without masses or tenderness.  Anus and perineum: Normal  Patient informed chaperone available to be present for breast and pelvic exam. Patient has requested no chaperone to be present. Patient has been advised what will be completed during breast and pelvic exam.   Assessment/Plan:  31 y.o. G0 to establish care.  Well female exam with routine gynecological exam - Education provided on SBEs, importance of preventative screenings, current guidelines, high calcium diet, regular exercise, and multivitamin daily. Labs with PCP.   Encounter for surveillance of contraceptive pills - Plan: norethindrone-ethinyl estradiol (NORTREL 7/7/7) 0.5/0.75/1-35 MG-MCG tablet daily. Taking as prescribed. Refill x 1 year provided.   Screening for cervical cancer - Normal pap history. Will repeat pap at 3-year interval per guidelines.   Follow up in 1 year for annual.      [name] [facility], 3:21 PM 11/26/2021

## 2022-11-24 ENCOUNTER — Encounter: Payer: Self-pay | Admitting: *Deleted

## 2022-11-25 NOTE — Progress Notes (Deleted)
No chief complaint on file.  Patient presents for f/u on anxiety.  Last seen a year ago.  Scheduled to see GYN for annual exam next week.  Anxiety: Lexapro was started in 01/2021. Last seen in 11/2021. She continues to do well on the 10mg  dose.   She hadn't been having any anxiety attacks.   She denies side effects. Previously noted some weight gain--gained 10# between May and November 2023. She wasn't eating well prior to being on medication (less motivated to make lunches, snacking rather than meals).  In November 2023 she was meal prepping, eating 3 meals/day. We reviewed her diet, overall healthy, worse when at her parents (snacked more on Doritos).  She continues to get counseling, now monthly.  ***UPDATE  Wt Readings from Last 3 Encounters:  11/26/21 133 lb 12.8 oz (60.7 kg)  11/20/21 132 lb 9.6 oz (60.1 kg)  06/12/21 122 lb 9.6 oz (55.6 kg)     Walks dog daily, 30 minutes, reports dog stops frequently.  No other exercise, though does report having a gym at her apartment complex she can use.   PMH, PSH, SH reviewed  Outpatient Encounter Medications as of 11/26/2022  Medication Sig   escitalopram (LEXAPRO) 10 MG tablet Take 1 tablet (10 mg total) by mouth at bedtime.   Multiple Vitamin (MULTIVITAMIN) capsule Take 1 capsule by mouth daily.   norethindrone-ethinyl estradiol (NORTREL 7/7/7) 0.5/0.75/1-35 MG-MCG tablet Take 1 tablet by mouth daily.   No facility-administered encounter medications on file as of 11/26/2022.   No Known Allergies  ROS: no fever, chills, URI symptoms, headaches, chest pain. No n/v/d, changes to hair/skin/nails/bowels/energy. Moods are good. Denies panic attacks, anxiety is controlled. Weight ***   PHYSICAL EXAM:  There were no vitals taken for this visit.  Wt Readings from Last 3 Encounters:  11/26/21 133 lb 12.8 oz (60.7 kg)  11/20/21 132 lb 9.6 oz (60.1 kg)  06/12/21 122 lb 9.6 oz (55.6 kg)   Well-appearing ,pleasant female, in good  spirits HEENT: conjunctiva and sclera are clear, EOMI.   Neck: no lymphadenopathy, thyromegaly or mass Heart: regular rate and rhythm Lungs: clear bilaterally Back: no spinal or CVA tenderness Abdomen: soft, nontender, no mass Extremities: no edema, normal pulses Neuro: alert and oriented, cranial nerves grossly intact, normal gait Psych: normal mood, affect, hygiene and grooming      11/20/2021    3:36 PM 06/12/2021   11:10 AM 02/28/2021    3:36 PM 01/21/2021    3:06 PM  GAD 7 : Generalized Anxiety Score  Nervous, Anxious, on Edge 1 1 2 3   Control/stop worrying 0 1 2 3   Worry too much - different things 1 0 2 3  Trouble relaxing 0 0 1 3  Restless 0 0 1 3  Easily annoyed or irritable 1 1 0 1  Afraid - awful might happen 0 0 1 2  Total GAD 7 Score 3 3 9 18   Anxiety Difficulty Not difficult at all Not difficult at all Somewhat difficult Somewhat difficult      11/20/2021    3:39 PM 06/12/2021   11:08 AM 02/28/2021    3:34 PM 01/21/2021    3:01 PM 10/19/2019   11:18 AM  Depression screen PHQ 2/9  Decreased Interest 0 0 1 2 1   Down, Depressed, Hopeless 0 0 2 2 2   PHQ - 2 Score 0 0 3 4 3   Altered sleeping  1 3 0 3  Tired, decreased energy  0 1 3 3  Change in appetite  0 0 0 3  Feeling bad or failure about yourself   1 1 1 3   Trouble concentrating  0 1 1 0  Moving slowly or fidgety/restless  0 0 0 0  Suicidal thoughts  0 0 0 0  PHQ-9 Score  2 9 9 15   Difficult doing work/chores  Not difficult at all Somewhat difficult Very difficult Not difficult at all      ASSESSMENT/PLAN:  Phq-2 and gad-7 Offer flu/COVID   Last year-- Counseled in detail re: diet--Cutting back on processed foods, increasing vegetables, consider incorporating more plant-based proteins. Discussed portion control,  Counseled re: exercise recommendations. If continues to gain weight, consider that it could be from lexapro, and consider change to different SSRI (such as prozac), vs taper down lexapro dose,  if continues to have extremely well controlled anxiety.  She sees GYN yearly, has no other chronic issues. Can f/u 1 year, sooner prn.

## 2022-11-26 ENCOUNTER — Encounter: Payer: Managed Care, Other (non HMO) | Admitting: Family Medicine

## 2022-11-26 DIAGNOSIS — F411 Generalized anxiety disorder: Secondary | ICD-10-CM

## 2022-11-26 DIAGNOSIS — Z23 Encounter for immunization: Secondary | ICD-10-CM

## 2022-12-01 ENCOUNTER — Ambulatory Visit: Payer: Managed Care, Other (non HMO) | Admitting: Nurse Practitioner

## 2022-12-02 ENCOUNTER — Other Ambulatory Visit: Payer: Self-pay | Admitting: Nurse Practitioner

## 2022-12-02 DIAGNOSIS — Z3041 Encounter for surveillance of contraceptive pills: Secondary | ICD-10-CM

## 2022-12-02 NOTE — Telephone Encounter (Signed)
Med refill request: Nortrel 7/7/7 Last AEX: 11/26/21 Next AEX: 01/01/23 Last MMG (if hormonal med) n/a Refill authorized: Nortrel 7/7/7 #28. Sent to provider for review

## 2022-12-25 ENCOUNTER — Other Ambulatory Visit: Payer: Self-pay | Admitting: Nurse Practitioner

## 2022-12-25 DIAGNOSIS — Z3041 Encounter for surveillance of contraceptive pills: Secondary | ICD-10-CM

## 2022-12-25 NOTE — Telephone Encounter (Signed)
Med refill request: Nortrel 7/7/7 Last AEX: 11/26/21 Next AEX: 01/01/23 Last MMG (if hormonal med) n/a Refill authorized: Nortrel 7/7/7 #28, not refilling 90 days until pt sees provider

## 2022-12-28 ENCOUNTER — Other Ambulatory Visit: Payer: Self-pay | Admitting: Family Medicine

## 2022-12-28 DIAGNOSIS — F411 Generalized anxiety disorder: Secondary | ICD-10-CM

## 2023-01-01 ENCOUNTER — Ambulatory Visit: Payer: Managed Care, Other (non HMO) | Admitting: Nurse Practitioner

## 2023-01-22 ENCOUNTER — Other Ambulatory Visit: Payer: Self-pay | Admitting: Nurse Practitioner

## 2023-01-22 DIAGNOSIS — Z3041 Encounter for surveillance of contraceptive pills: Secondary | ICD-10-CM

## 2023-01-23 NOTE — Telephone Encounter (Signed)
 Med refill request: Nortrel 7/7/7 Pharmacy requests 90 day supply Last AEX: 11/26/21 Next AEX: 03/18/23 Last MMG (if hormonal med) n/a Refill authorized: Nortrel 7/7/7 #84 Sent to provider for approval or denial as appropriate.

## 2023-02-16 ENCOUNTER — Ambulatory Visit: Payer: Managed Care, Other (non HMO) | Admitting: Family Medicine

## 2023-02-16 ENCOUNTER — Encounter: Payer: Self-pay | Admitting: Family Medicine

## 2023-02-16 VITALS — BP 110/60 | HR 92 | Ht 59.0 in | Wt 136.4 lb

## 2023-02-16 DIAGNOSIS — F411 Generalized anxiety disorder: Secondary | ICD-10-CM | POA: Diagnosis not present

## 2023-02-16 DIAGNOSIS — L299 Pruritus, unspecified: Secondary | ICD-10-CM

## 2023-02-16 MED ORDER — HYDROXYZINE PAMOATE 25 MG PO CAPS: 25.0000 mg | ORAL_CAPSULE | Freq: Four times a day (QID) | ORAL | 0 refills | Status: DC | PRN

## 2023-02-16 NOTE — Progress Notes (Signed)
Chief Complaint  Patient presents with   Dry skin    Has had extremely dry, itchy skin x 1 week. Got a humidifier for her apt. Using Aveno body wash, Vaseline Intensive Care lotion. Has not changed any detergent.    Monday or Tuesday of last week she noticed skin was slightly itchy. By mid-week she had trouble falling asleep due to itching.  Itching was worse at night, thinks because she is less distracted then.  Now it itchy all the time.  Tried humidifier, moisturizing very frequently.  Didn't help much. She is taking cooler showers. She has been taking benadryl at night, and zyrtec in the morning.  Hasn't noticed any improvement.  Benadryl doesn't make her sleepy (never has).  Last night she couldn't sleep because her skin was so itchy.  Denies any rashes.  Sometimes skin feels a little warmer.  Sometimes it is red, from scratching, but otherwise no rash. No new products, perfumes, detergents.  "I could be better" about hydrating.  Drinks 32 oz of water daily, and coffee in the morning.  Last night she couldn't sleep because her skin was so itchy.    PMH, PSH, SH reviewed  Last seen by me in 11/2021. She cancelled her 11/2022 visit due to car issue.  Outpatient Encounter Medications as of 02/16/2023  Medication Sig   escitalopram (LEXAPRO) 10 MG tablet TAKE 1 TABLET BY MOUTH EVERYDAY AT BEDTIME   hydrOXYzine (VISTARIL) 25 MG capsule Take 1 capsule (25 mg total) by mouth every 6 (six) hours as needed.   Multiple Vitamin (MULTIVITAMIN) capsule Take 1 capsule by mouth daily.   NORTREL 7/7/7 0.5/0.75/1-35 MG-MCG tablet TAKE 1 TABLET BY MOUTH EVERY DAY   No facility-administered encounter medications on file as of 02/16/2023.   NOT taking hydroxyzine prior to today's visit  No Known Allergies  ROS: no f/c, URI symptoms, HA or dizziness. Denies chest pain, palpitations, DOE No n/v/d or bowel changes. No energy or weight changes--tired this week due to sleep being affected by  itching. No joint pains, bleeding, bruising, rashes. Moods are good. Continues on Lexapro    PHYSICAL EXAM:  BP 110/60   Pulse 92   Ht 4\' 11"  (1.499 m)   Wt 136 lb 6.4 oz (61.9 kg)   LMP 02/04/2023 (Approximate)   BMI 27.55 kg/m   Wt Readings from Last 3 Encounters:  02/16/23 136 lb 6.4 oz (61.9 kg)  11/26/21 133 lb 12.8 oz (60.7 kg)  11/20/21 132 lb 9.6 oz (60.1 kg)   Well-appearing, pleasant female, in no distress HEENT: conjunctiva and sclera are clear, anicteric. EOMI. OP is clear Neck: no lymphadenopathy, thyromegaly or mass Heart: regular rate and rhythm Lungs: clear bilaterally Back: no spinal or CVA tenderness Abdomen: soft, nontender, no masse Extremities: no edema, normal pulses. Wearing sandals, normal skin, no tinea pedis Psych: normal mood, affect, hygiene and grooming Neuro: alert and oriented, cranial nerves grossly intact, normal gait. Skin: normal turgor.  Only slightly dry.  Left upper arm, above elbow--pink/warm (pt had been rubbing at the area), otherwise clear, no rashes present.     ASSESSMENT/PLAN:  Pruritus - Ddx reviewed. Declined labs today. Will hydrate, moisturize, use hydroxyzine. Labs if not improving - Plan: hydrOXYzine (VISTARIL) 25 MG capsule  Generalized anxiety disorder - doing well on lexapro   She will reschedule her CPE. Plans to go to [place] in July, will do travel consult/discussion at physical. Advised to send itinerary prior to her visit.  I spent 35 minutes  dedicated to the care of this patient, including pre-visit review of records, face to face time, post-visit ordering of testing and documentation.   Drink more water (goal is very pale yellow or clear urine), 48-64 ounces daily (minimum). Try using cerevie or cetaphil CREAM (not lotion) at least 3x/day. You may use hydrocortisone cream (1%) up to three times daily if needed--definitely to the red area on the left arm, and to other very itchy areas. Cool compresses  can also be helpful. For the in between times, you can try Sarna anti-itch lotion (menthol/cooling cream), and/or Aveeno baths as needed.  Use the hydroxyzine as needed for itching.  If it makes you sleepy, use it just at night.  Next step, if you aren't getting better with these measures, is to do some bloodwork to evaluate for liver/kidney/thyroid issues which can cause itching.

## 2023-02-16 NOTE — Patient Instructions (Addendum)
  Drink more water (goal is very pale yellow or clear urine), 48-64 ounces daily (minimum). Try using cerevie or cetaphil CREAM (not lotion) at least 3x/day. You may use hydrocortisone cream (1%) up to three times daily if needed--definitely to the red area on the left arm, and to other very itchy areas. Cool compresses can also be helpful. For the in between times, you can try Sarna anti-itch lotion (menthol/cooling cream), and/or Aveeno baths as needed.  Use the hydroxyzine as needed for itching.  If it makes you sleepy, use it just at night. You can double up at night, if needed.  Next step, if you aren't getting better with these measures, is to do some bloodwork to evaluate for liver/kidney/thyroid issues which can cause itching.    Send [place] detailed information regarding your itinerary (where you are going/staying) prior to your appointment before travel to [place].

## 2023-03-18 ENCOUNTER — Ambulatory Visit (INDEPENDENT_AMBULATORY_CARE_PROVIDER_SITE_OTHER): Payer: Managed Care, Other (non HMO) | Admitting: Nurse Practitioner

## 2023-03-18 ENCOUNTER — Encounter: Payer: Self-pay | Admitting: Nurse Practitioner

## 2023-03-18 ENCOUNTER — Other Ambulatory Visit (HOSPITAL_COMMUNITY)
Admission: RE | Admit: 2023-03-18 | Discharge: 2023-03-18 | Disposition: A | Discharge status: Home / Self Care | Source: Ambulatory Visit | Attending: Nurse Practitioner | Admitting: Nurse Practitioner

## 2023-03-18 VITALS — BP 122/70 | HR 64 | Ht 59.0 in | Wt 137.0 lb

## 2023-03-18 DIAGNOSIS — Z01419 Encounter for gynecological examination (general) (routine) without abnormal findings: Secondary | ICD-10-CM | POA: Diagnosis not present

## 2023-03-18 DIAGNOSIS — Z1331 Encounter for screening for depression: Secondary | ICD-10-CM | POA: Diagnosis not present

## 2023-03-18 DIAGNOSIS — Z3041 Encounter for surveillance of contraceptive pills: Secondary | ICD-10-CM

## 2023-03-18 DIAGNOSIS — Z124 Encounter for screening for malignant neoplasm of cervix: Secondary | ICD-10-CM | POA: Insufficient documentation

## 2023-03-18 DIAGNOSIS — F3341 Major depressive disorder, recurrent, in partial remission: Secondary | ICD-10-CM | POA: Diagnosis not present

## 2023-03-18 MED ORDER — NORTREL 7/7/7 0.5/0.75/1-35 MG-MCG PO TABS: 1.0000 | ORAL_TABLET | Freq: Every day | ORAL | 4 refills | Status: AC

## 2023-03-18 NOTE — Progress Notes (Signed)
 Gina Mcfarland November 21, 1990 [id number]   History:  33 y.o. G0 presents for annual exam. Monthly cycle on OCPs. Normal pap history. History of nocturia/OAB, no longer on Oxybutynin, did pelvic floor PT with good management. She is unsure if she has had Gardasil series. Anxiety and depression managed by PCP. Taking Lexapro, sees therapist.   Gynecologic History Patient's last menstrual period was 03/03/2023 (approximate). Period Cycle (Days): 28 Period Duration (Days): 3-4 Period Pattern: Regular Menstrual Flow: Moderate Menstrual Control: Maxi pad Menstrual Control Change Freq (Hours): 6 Dysmenorrhea: None Contraception/Family planning: OCP (estrogen/progesterone) Sexually active: No  Health Maintenance Last Pap: 11/22/2019. Results were: Normal Last mammogram: Not indicated Last colonoscopy: Not indicated Last Dexa: Not indicated     03/18/2023   12:04 PM  Depression screen PHQ 2/9  Decreased Interest 1  Down, Depressed, Hopeless 2  PHQ - 2 Score 3  Altered sleeping 1  Tired, decreased energy 1  Change in appetite 0  Feeling bad or failure about yourself  1  Trouble concentrating 1  Moving slowly or fidgety/restless 0  Suicidal thoughts 0  PHQ-9 Score 7  Difficult doing work/chores Somewhat difficult     Past medical history, past surgical history, family history and social history were all reviewed and documented in the EPIC chart. 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   ROS:  A ROS was performed and pertinent positives and negatives are included.  Exam:  Vitals:   03/18/23 1159  BP: 122/70  Pulse: 64  SpO2: 100%  Weight: 137 lb (62.1 kg)  Height: 4\' 11"  (1.499 m)      Body mass index is 27.67 kg/m.  General appearance:  Normal Thyroid:  Symmetrical, normal in size, without palpable masses or nodularity. Respiratory  Auscultation:  Clear without wheezing or rhonchi Cardiovascular  Auscultation:  Regular rate, without rubs, murmurs or  gallops  Edema/varicosities:  Not grossly evident Abdominal  Soft,nontender, without masses, guarding or rebound.  Liver/spleen:  No organomegaly noted  Hernia:  None appreciated  Skin  Inspection:  Grossly normal   Breasts: Examined lying and sitting.   Right: Without masses, retractions, discharge or axillary adenopathy.   Left: Without masses, retractions, discharge or axillary adenopathy. Pelvic: External genitalia:  no lesions              Urethra:  normal appearing urethra with no masses, tenderness or lesions              Bartholins and Skenes: normal                 Vagina: normal appearing vagina with normal color and discharge, no lesions              Cervix: no lesions Bimanual Exam:  Uterus:  no masses or tenderness              Adnexa: no mass, fullness, tenderness              Rectovaginal: Deferred              Anus:  normal, no lesions  Patient informed chaperone available to be present for breast and pelvic exam. Patient has requested no chaperone to be present. Patient has been advised what will be completed during breast and pelvic exam.   Assessment/Plan:  33 y.o. G0 for annual exam.   Well female exam with routine gynecological exam - Education provided on SBEs, importance of preventative screenings, current guidelines, high calcium diet, regular exercise, and multivitamin  daily. Labs with PCP.   Encounter for surveillance of contraceptive pills - Plan: norethindrone-ethinyl estradiol (NORTREL 7/7/7) 0.5/0.75/1-35 MG-MCG tablet daily. Taking as prescribed. Refill x 1 year provided.   Cervical cancer screening - Plan: Cytology - PAP( [facility]). Normal pap history.   Recurrent major depressive disorder, in partial remission (HCC)  - PHQ 9-7. Feels this is situational and work-related. Managed by PCP. Doing well on Lexapro and seeing therapist.   Return in about 1 year (around 03/17/2024) for Annual.     [name] [facility], 12:22 PM 03/18/2023

## 2023-03-20 LAB — CYTOLOGY - PAP
Comment: NEGATIVE
Diagnosis: NEGATIVE
High risk HPV: NEGATIVE

## 2023-03-23 ENCOUNTER — Encounter: Payer: Self-pay | Admitting: Nurse Practitioner

## 2023-05-19 ENCOUNTER — Encounter: Payer: Self-pay | Admitting: Family Medicine

## 2023-05-19 NOTE — Progress Notes (Deleted)
 No chief complaint on file.  Gina Mcfarland is a 33 y.o. female who presents for a complete physical.  She had WWE with GYN in March. She is also here for travel consult. She will be traveling to [place] in July.  Anxiety:  Lexapro  was started in 01/2021. She has been doing well on the 10mg  dose.  She hasn't been having any anxiety attacks. She denies side effects. She has some work stress. She continues to get counseling, monthly. ***UPDATE  She reports eating a healthy diet.  Eats salads, chicken.  She likes cheese, doesn't have daily. Eats eggs on the weekends. Has lunchmeats from the deli counter. She eats more fruits than vegetables. Drinks mostly water. She may have a hard cider after work (low calorie) or a small glass of wine, not daily. Very little soda. She generally eats well, has healthy snacks (may have Doritos when at parents home).     Immunization History  Administered Date(s) Administered   Influenza,inj,Quad PF,6+ Mos 01/21/2021, 11/20/2021   Influenza-Unspecified 12/23/2001, 07/19/2010, 12/27/2010, 01/10/2016, 12/05/2022   Moderna Sars-Covid-2 Vaccination 03/10/2019, 04/07/2019   PFIZER(Purple Top)SARS-COV-2 Vaccination 12/23/2019   Pfizer Covid-19 Vaccine Bivalent Booster 45yrs & up 01/21/2021   Pfizer(Comirnaty)Fall Seasonal Vaccine 12 years and older 11/20/2021, 12/05/2022   Tdap 08/16/2004, 03/13/2016   Last Pap smear: 03/2023, NILM, HPV negative Last mammogram: never Last colonoscopy: never Last DEXA: never Dentist: Ophtho: Exercise:  Walks dog daily, 30 minutes, reports dog stops frequently.  No other exercise, though does report having a gym at her apartment complex she can use.   PMH, PSH, SH and FH were reviewed and updated    Review of Systems  Constitutional: Negative.  Negative for chills, fever and weight loss.  HENT:  Negative for congestion, ear pain and hearing loss.   Eyes: Negative.   Respiratory:  Negative for cough, shortness of  breath and wheezing.   Cardiovascular:  Negative for chest pain, palpitations and leg swelling.  Gastrointestinal:  Negative for abdominal pain, blood in stool, constipation, diarrhea, melena, nausea and vomiting.  Genitourinary:  Negative for dysuria, frequency, hematuria and urgency.  Musculoskeletal:  Negative for joint pain.  Skin:  Negative for rash.  Neurological:  Negative for dizziness, tingling, tremors, weakness and headaches.  Endo/Heme/Allergies:  Does not bruise/bleed easily.  Psychiatric/Behavioral:  The patient does not have insomnia.    Moods per HPI  PHYSICAL EXAM:  There were no vitals taken for this visit.  Wt Readings from Last 3 Encounters:  03/18/23 137 lb (62.1 kg)  02/16/23 136 lb 6.4 oz (61.9 kg)  11/26/21 133 lb 12.8 oz (60.7 kg)    General Appearance:    Alert, cooperative, no distress, appears stated age  Head:    Normocephalic, without obvious abnormality, atraumatic  Eyes:    PERRL, conjunctiva/corneas clear, EOM's intact, fundi    benign  Ears:    Normal TM's and external ear canals  Nose:   No drainage or sinus   tenderness  Throat:   Lips, mucosa, and tongue normal; teeth and gums normal  Neck:   Supple, no lymphadenopathy;  thyroid:  no enlargement/ tenderness/nodules; no carotid bruit or JVD  Back:    Spine nontender, no curvature, ROM normal, no CVA     tenderness  Lungs:     Clear to auscultation bilaterally without wheezes, rales or     ronchi; respirations unlabored  Chest Wall:    No tenderness or deformity   Heart:    Regular rate  and rhythm, S1 and S2 normal, no murmur, rub   or gallop  Breast Exam:    Deferred to GYN  Abdomen:     Soft, non-tender, nondistended, normoactive bowel sounds,    no masses, no hepatosplenomegaly  Genitalia:    Deferred to GYN     Extremities:   No clubbing, cyanosis or edema  Pulses:   2+ and symmetric all extremities  Skin:   Skin color, texture, turgor normal, no rashes or lesions  Lymph nodes:    Cervical, supraclavicular, and axillary nodes normal  Neurologic:   CNII-XII intact, normal strength, sensation and gait; reflexes 2+ and symmetric throughout          Psych:   Normal mood, affect, hygiene and grooming.         11/20/2021    3:36 PM 06/12/2021   11:10 AM 02/28/2021    3:36 PM 01/21/2021    3:06 PM  GAD 7 : Generalized Anxiety Score  Nervous, Anxious, on Edge 1 1 2 3   Control/stop worrying 0 1 2 3   Worry too much - different things 1 0 2 3  Trouble relaxing 0 0 1 3  Restless 0 0 1 3  Easily annoyed or irritable 1 1 0 1  Afraid - awful might happen 0 0 1 2  Total GAD 7 Score 3 3 9 18   Anxiety Difficulty Not difficult at all Not difficult at all Somewhat difficult Somewhat difficult       03/18/2023   12:04 PM 11/20/2021    3:39 PM 06/12/2021   11:08 AM 02/28/2021    3:34 PM 01/21/2021    3:01 PM  Depression screen PHQ 2/9  Decreased Interest 1 0 0 1 2  Down, Depressed, Hopeless 2 0 0 2 2  PHQ - 2 Score 3 0 0 3 4  Altered sleeping 1  1 3  0  Tired, decreased energy 1  0 1 3  Change in appetite 0  0 0 0  Feeling bad or failure about yourself  1  1 1 1   Trouble concentrating 1  0 1 1  Moving slowly or fidgety/restless 0  0 0 0  Suicidal thoughts 0  0 0 0  PHQ-9 Score 7  2 9 9   Difficult doing work/chores Somewhat difficult  Not difficult at all Somewhat difficult Very difficult      ASSESSMENT/PLAN:   PHQ-9 and GAD-7  We have no childhood vaccines on her--is she from [place]? In [place]? Need to know if had MMR x 2, HepA or Hep B. Guessing she might need Hep A and B vaccines today, likely had MMR.    She was supposed to send us  her travel itinerary PRIOR to her visit so that I could get the info needed... She did not send. Message sent to her Tues night  Shouldn't need lexapro --for some reason you refilled a year supply in 12/2022 for her.  Discussed monthly self breast exams and yearly mammograms after the age of 36; at least 30 minutes of aerobic activity at least  5 days/week; proper sunscreen use reviewed; healthy diet, including goals of calcium and vitamin D intake and alcohol recommendations (less than or equal to 1 drink/day) reviewed; regular seatbelt use; changing batteries in smoke detectors.  Immunization recommendations discussed.  Colonoscopy recommendations reviewed   [place] Hep A, B  Malaria--chloroquine resistance. Can use atovaquone-proguanil, doxy, mefloquine, tafenoquine  Typhoid only if rural, smaller cities, staying with friends/relatives  Monkeypox is there (she is  low risk) Ensure MMR (no active outbreaks) Widespread cholera

## 2023-05-20 ENCOUNTER — Encounter: Admitting: Family Medicine

## 2023-05-20 ENCOUNTER — Encounter: Payer: Managed Care, Other (non HMO) | Admitting: Family Medicine

## 2023-05-20 ENCOUNTER — Telehealth: Payer: Self-pay | Admitting: Internal Medicine

## 2023-05-20 DIAGNOSIS — Z7184 Encounter for health counseling related to travel: Secondary | ICD-10-CM

## 2023-05-20 DIAGNOSIS — Z0000 Encounter for general adult medical examination without abnormal findings: Secondary | ICD-10-CM

## 2023-05-20 DIAGNOSIS — F411 Generalized anxiety disorder: Secondary | ICD-10-CM

## 2023-05-20 NOTE — Telephone Encounter (Signed)
 Spoke w/patient and scheduled for for travel consult. She will check with employer about the GYN visit to see if that would work. I did put her on cx list.

## 2023-05-20 NOTE — Telephone Encounter (Signed)
 Please advise as I do not see any open slots for you, as well as at this time there is no openings for the month of may for any other providers.   Copied from CRM [id number]. 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 States her employer requires her to have it done by the end of the month or she will be charged a monthly fee until it is complete. No appointments showing until March 2026. Patient is inquiring if anything can be done to get her in sooner.  Patient can be reached at [phone]

## 2023-05-20 NOTE — Telephone Encounter (Signed)
 Needs to be charged for no-show for today's visit. (She is the one who switched her physical from the afternoon to the morning)  I currently don't have any openings before the end of the month--she can be put on a cancellation list (not sure if [facility] keeps one for physicals or not).  I have NEVER done a physical on her--is this a new requirement that it has to be from PCP? She DID have a WWE in March from her GYN (which counts for our insurance as a yearly physical, will it not for hers?)  She can reschedule for her travel encounter separately (we can get her in for that sooner).

## 2023-05-21 ENCOUNTER — Encounter: Payer: Self-pay | Admitting: Nurse Practitioner

## 2023-06-10 NOTE — Progress Notes (Unsigned)
 No chief complaint on file.  Patient presents for travel consult.  She will be going to [place] in July. Will be working at the [employer] in [place], [place]  for two weeks.  Then going on safari for a week, traveling primarily through the [place] and [employer]. Planning to go to [facility] as well as a coffee farm while visiting a [address] village.     Immunization History  Administered Date(s) Administered   DTaP 07/30/1992, 10/02/1992, 12/03/1992, 08/30/1993   Hepatitis B, PED/ADOLESCENT November [birth year] 1992, 06/25/1992, 05/27/1993   IPV 07/30/1992, 10/02/1992, 12/03/1992, 08/29/1996   Influenza,inj,Quad PF,6+ Mos 01/21/2021, 11/20/2021   Influenza-Unspecified 12/23/2001, 07/19/2010, 12/27/2010, 01/10/2016, 12/05/2022   MMR 08/30/1993, 08/29/1996   Meningococcal Conjugate 07/19/2010   Moderna Sars-Covid-2 Vaccination 03/10/2019, 04/07/2019   PFIZER(Purple Top)SARS-COV-2 Vaccination 12/23/2019   Pfizer Covid-19 Vaccine Bivalent Booster 56yrs & up 01/21/2021   Pfizer(Comirnaty)Fall Seasonal Vaccine 12 years and older 11/20/2021, 12/05/2022   Tdap 08/29/1996, 08/16/2004, 03/13/2016   Varicella 12/06/1993       PMH, PSH, SH reviewed   ROS:    PHYSICAL EXAM:  There were no vitals taken for this visit.  Wt Readings from Last 3 Encounters:  03/18/23 137 lb (62.1 kg)  02/16/23 136 lb 6.4 oz (61.9 kg)  11/26/21 133 lb 12.8 oz (60.7 kg)        ASSESSMENT/PLAN:   Hep A#1 NV for #2 in 6 months Varicella IgG to see if immune, and if not, give booster.  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  Take with food or milk) doxy, mefloquine, tafenoquine  Typhoid recommended for most travelers, esp for those staying with friends, relatives, or visiting smaller cities or rural areas. Vivotif.  Complete >1 week prior to travel  [facility]??  Diamox 125mg  before climb, then BID during trek (can take 24-48 hours prior to rapid ascent and throughout climb) .  Ensure not pregnant (for malaria, others)

## 2023-06-10 NOTE — Patient Instructions (Incomplete)
 Go to the [facility] website and look up traveller information for [place] for helpful information--about eating and drinking safely, preventing bug bites, reducing exposure to germs, etc.  There is some packing info as well.  Take the Vivotif in 2 weeks (since we are giving you 2 other vaccines today). Just be sure to finish it a week before traveling.

## 2023-06-11 ENCOUNTER — Encounter: Payer: Self-pay | Admitting: Family Medicine

## 2023-06-11 ENCOUNTER — Ambulatory Visit: Admitting: Family Medicine

## 2023-06-11 ENCOUNTER — Telehealth: Payer: Self-pay | Admitting: Family Medicine

## 2023-06-11 VITALS — BP 108/62 | HR 80 | Ht 59.0 in | Wt 136.2 lb

## 2023-06-11 DIAGNOSIS — Z7184 Encounter for health counseling related to travel: Secondary | ICD-10-CM

## 2023-06-11 DIAGNOSIS — Z23 Encounter for immunization: Secondary | ICD-10-CM

## 2023-06-11 MED ORDER — ATOVAQUONE-PROGUANIL HCL 250-100 MG PO TABS
1.0000 | ORAL_TABLET | Freq: Every day | ORAL | 0 refills | Status: DC
Start: 2023-06-11 — End: 2023-11-11

## 2023-06-11 MED ORDER — TYPHOID VACCINE PO CPDR: 1.0000 | DELAYED_RELEASE_CAPSULE | ORAL | 0 refills | Status: DC

## 2023-06-11 NOTE — Telephone Encounter (Signed)
 Called pt to schedule a nurse/lab visit for 6 months out as of today for Hepatitis A vaccine #2 per Dr.Knapp.

## 2023-11-10 ENCOUNTER — Ambulatory Visit: Payer: Self-pay

## 2023-11-10 NOTE — Progress Notes (Unsigned)
 No chief complaint on file.   she noticed the reddened area on Saturday and it has just grown since then. She has tried to take care of at home with pimple patches and other home remedies. Pt states it is now making it uncomfortable to try to sleep.    PMH, PSH, SH reviewed   ROS:    PHYSICAL EXAM:  There were no vitals taken for this visit.      ASSESSMENT/PLAN:

## 2023-11-10 NOTE — Telephone Encounter (Signed)
 FYI Only or Action Required?: FYI only for provider.  Patient was last seen in primary care on 06/11/2023 by Randol Dawes, MD.  Called Nurse Triage reporting Rash.  Symptoms began several days ago.  Interventions attempted: Rest, hydration, or home remedies.  Symptoms are: gradually worsening.  Triage Disposition: See Physician Within 24 Hours  Patient/caregiver understands and will follow disposition?: Yes     Copied from CRM [id number]. Topic: Clinical - Red Word Triage >> Nov 10, 2023  3:35 PM Hadassah PARAS wrote: Red Word that prompted transfer to Nurse Triage: area beneath ear is swollen, Large Blind pimple Underneath skin extremely painful, sensitive to touch Reason for Disposition  Boil > 2 inches across (> 5 cm; larger than a golf ball or ping pong ball)  Answer Assessment - Initial Assessment Questions Pt states she noticed the reddened area on Saturday and it has just grown since then. She has tried to take care of at home with pimple patches and other home remedies. Pt states it is now making it uncomfortable to try to sleep.   1. APPEARANCE of BOIL: What does the boil look like?      Really red 2. LOCATION: Where is the boil located?      Under left ear 3. NUMBER: How many boils are there?      1 4. SIZE: How big is the boil? (e.g., inches, cm; compare to size of a coin or other object)     About 3 fingers wide, 2 across.  5. ONSET: When did the boil start?     Saturday  6. PAIN: Is there any pain? If Yes, ask: How bad is the pain?   (Scale 1-10; or mild, moderate, severe)     6-7 right now, worse with touch 7. FEVER: Do you have a fever? If Yes, ask: What is it, how was it measured, and when did it start?      no 8. SOURCE: Have you been around anyone with boils or other Staph infections? Have you ever had boils before?     unknown 9. OTHER SYMPTOMS: Do you have any other symptoms? (e.g., shaking chills, weakness, rash elsewhere on body)     No  other symptosm  Protocols used: Boil (Skin Abscess)-A-AH

## 2023-11-11 ENCOUNTER — Ambulatory Visit: Admitting: Family Medicine

## 2023-11-11 ENCOUNTER — Encounter: Payer: Self-pay | Admitting: Family Medicine

## 2023-11-11 VITALS — BP 110/70 | HR 80 | Temp 98.3°F | Ht 59.0 in | Wt 136.2 lb

## 2023-11-11 DIAGNOSIS — L02811 Cutaneous abscess of head [any part, except face]: Secondary | ICD-10-CM

## 2023-11-11 DIAGNOSIS — L03811 Cellulitis of head [any part, except face]: Secondary | ICD-10-CM | POA: Diagnosis not present

## 2023-11-11 MED ORDER — DOXYCYCLINE HYCLATE 100 MG PO TABS: 100.0000 mg | ORAL_TABLET | Freq: Two times a day (BID) | ORAL | 0 refills | Status: AC

## 2023-11-11 NOTE — Patient Instructions (Addendum)
 You may use ibuprofen or Aleve as needed for pain (I recommend Aleve--taking 2 pills twice daily with food is equivalent to a prescription strength. You don't need to take 2 pills if/when your pain lessens). You can use tylenol along with this, if needed.  Continue warm compresses. Take the antibiotics as directed. Avoid the sun (fine to be outside, just use sunscreen).  Return if you have increase in redness, pain, fever. Contact us  if you cannot tolerate the antibiotic that was prescribed.  You may wash the area regularly, with soap and water. You likely should keep it covered for the first few days, if there is still active drainage. When that stops, you may leave it open to air.

## 2023-11-16 ENCOUNTER — Ambulatory Visit: Payer: Self-pay | Admitting: Family Medicine

## 2023-11-16 LAB — WOUND CULTURE: Organism ID, Bacteria: NONE SEEN

## 2023-12-16 ENCOUNTER — Other Ambulatory Visit (INDEPENDENT_AMBULATORY_CARE_PROVIDER_SITE_OTHER)

## 2023-12-16 DIAGNOSIS — Z23 Encounter for immunization: Secondary | ICD-10-CM | POA: Diagnosis not present

## 2024-01-06 ENCOUNTER — Other Ambulatory Visit: Payer: Self-pay | Admitting: Family Medicine

## 2024-01-06 DIAGNOSIS — F411 Generalized anxiety disorder: Secondary | ICD-10-CM
